# Patient Record
Sex: Female | Born: 1940 | Race: White | Hispanic: No | Marital: Married | Smoking: Never smoker
Health system: Southern US, Community
[De-identification: ages and names within clinical notes are randomized; demographics above are authoritative.]

## PROBLEM LIST (undated history)

## (undated) DIAGNOSIS — E119 Type 2 diabetes mellitus without complications: Secondary | ICD-10-CM

## (undated) DIAGNOSIS — I10 Essential (primary) hypertension: Secondary | ICD-10-CM

## (undated) DIAGNOSIS — K219 Gastro-esophageal reflux disease without esophagitis: Secondary | ICD-10-CM

## (undated) DIAGNOSIS — E039 Hypothyroidism, unspecified: Secondary | ICD-10-CM

## (undated) DIAGNOSIS — E785 Hyperlipidemia, unspecified: Secondary | ICD-10-CM

## (undated) DIAGNOSIS — Z9889 Other specified postprocedural states: Secondary | ICD-10-CM

## (undated) HISTORY — PX: ABDOMINAL HYSTERECTOMY: SHX81

## (undated) HISTORY — PX: NECK SURGERY: SHX720

## (undated) HISTORY — DX: Other forms of dyspnea: R06.09

---

## 2005-08-26 ENCOUNTER — Observation Stay (HOSPITAL_COMMUNITY): Admission: EM | Admit: 2005-08-26 | Discharge: 2005-08-27 | Payer: Self-pay | Admitting: Emergency Medicine

## 2006-10-10 ENCOUNTER — Observation Stay (HOSPITAL_COMMUNITY): Admission: EM | Admit: 2006-10-10 | Discharge: 2006-10-12 | Payer: Self-pay | Admitting: Emergency Medicine

## 2010-08-29 NOTE — H&P (Signed)
[name], [name] NO.:  [id number]   MEDICAL RECORD NO.:  [id number]          PATIENT TYPE:  EMS   LOCATION:  MAJO                         FACILITY:  [facility]   PHYSICIAN:  Herbie Saxon, MDDATE OF BIRTH:  02/28/1941   DATE OF ADMISSION:  10/10/2006  DATE OF DISCHARGE:                              HISTORY & PHYSICAL   PRIMARY CARE PHYSICIAN:  Unassigned.   The patient is from [place].   PRESENTING COMPLAINT:  Left arm numbness.  Left chest pain, 2 days'  duration.   HISTORY OF PRESENTING COMPLAINT:  This is a 70 year old Caucasian lady  who noticed left arm and finger tingling with numbness yesterday evening  associated with slurred speech and weakness in the left hand.  All these  symptoms have improved overnight; however, she had been experiencing  dull 6/10 left-sided chest pain radiating to the left side of the neck  associated with mild shortness of breath, nausea, and weakness.  There  is no episode of syncope.  There is no difficulty with swallowing.  The  patient denied any facial droop.  However, she noticed some deviation of  her left eye to the left yesterday, but this has resolved.  The patient  also gives a positive history for intermittent ankle swelling, heat  intolerance, and intermittent diarrhea.   PAST MEDICAL HISTORY:  1. Diabetes.  2. Hypercholesterolemia.  3. Hypertension.  4. Hypothyroid.   PAST SURGERIES:  1. Hysterectomy.  2. Cardiac catheterization, which was normal, 2007, performed by Dr.      Clarene Duke.   FAMILY HISTORY:  Mother had brain aneurysm, and father had pancreatic  cancer.   SOCIAL HISTORY:  She denies any history of alcohol, tobacco, or drug  use.   DRUG ALLERGIES:  CODEINE and IV DYE.   MEDICATIONS:  1. Avandia 4/500 mg daily.  2. Lisinopril 1 tablet daily.  3. Synthroid 50 mcg daily.  4. Aspirin 1 tablet daily.  5. Trazodone 1 tablet daily.  6. Centrum.  7. Fish oil.  8. Vytorin.   PHYSICAL EXAMINATION:  GENERAL:  She is an elderly lady, and she is not  in acute respiratory distress.  VITAL SIGNS:  Temperature is 98, pulse is 82, respiratory rate is 16,  blood pressure 159/63.  NECK:  Supple.  There is no lymphadenopathy.  HEAD:  Atraumatic, normocephalic.  Oropharynx is clear.  There is no  carotid bruits.  CHEST:  Clinically clear.  BREAST:  No masses.  HEART:  Sounds 1 and 2, no murmurs.  ABDOMEN:  She has truncal obesity, soft, nontender.  NEUROLOGIC:  She is alert, oriented x3.  There is no facial palsy, no  gaze deviation.  Extraocular muscles are intact.  Power is 5 in all  limbs.  Deep tendon reflexes are 2 in all limbs.  Mild left arm  monoparesis.  Cranial nerves I to XII grossly intact.  Peripheral pulses  present.  No edema.   AVAILABLE LABORATORY:  WBC is 10, hematocrit 38, platelet count is 333.  Troponin less than 0.05.  Glucose is 109.  Sodium 137, potassium 4.7,  chloride 106,  bicarbonate 24.  BUN 32, creatinine 1.9, AST is 94, ALT is  113, D-dimer is 0.62.   Also note the patient has a history of claustrophobia.   ASSESSMENT:  1. Transient ischemic attack.  2. Chest pain.  Rule out pulmonary embolism.  3. Acute-on-chronic renal failure.  4. Elevated liver function tests on Statins.  5. Moderately severe hypertension.  6. Anxiety.  7. Diabetes is stable.  8. Hypercholesterolemia.  9. Hypothyroid.   The patient is to be admitted to telemetry bed.  We will get an MRI of  the brain and MRI of the chest.  Follow up cardiac enzymes and EKG every  8 x3.  Gentle IV fluid hydration.  We will hold her lisinopril,  metformin, and Vytorin.  Protonix 40 mg IV daily.  Lovenox 30 mg subcu  daily.  Morphine 2 mg IV every 6 hours p.r.n.  O2 two liters nasal  cannula.  Repeat pulse oxygen at 92.  Aspirin 325 mg p.o. daily.  Metoprolol 25 mg b.i.d.  Check the phosphate and CMP in the morning.  I  will obtain abdominal ultrasound to see the liver and the  renal anatomy.  Morphine 2 mg IV every 6 hours p.r.n. for chest pain.  Xanax 0.5 mg p.o.  b.i.d.  Prior to MRI, the patient should be given Ativan 2 mg IV; start  5 to 10 minutes prior to MRI and then every 8 hours p.r.n.  She is a  Full Code.  EMS medication and tests explained to her.  She verbalizes  understanding.      Herbie Saxon, MD  Electronically Signed     MIO/MEDQ  D:  10/10/2006  T:  10/11/2006  Job:  [id number]

## 2010-08-29 NOTE — Discharge Summary (Signed)
[name], [name] NO.:  [id number]   MEDICAL RECORD NO.:  [id number]          PATIENT TYPE:  INP   LOCATION:  3733                         FACILITY:  [facility]   PHYSICIAN:  Beckey Rutter, MD  DATE OF BIRTH:  [birth year]   DATE OF ADMISSION:  10/10/2006  DATE OF DISCHARGE:  10/12/2006                               DISCHARGE SUMMARY   PRIMARY CARE PHYSICIAN:  Dr. Welton Rogers.   CHIEF COMPLAINT:  1. Left arm numbness.  2. Left chest pain for 2 days duration.   HISTORY OF PRESENT ILLNESS:  A 70 year old Caucasian with tingling left  fingers and hands, associated with some weakness.  Patient has stated  that these episodes come to her frequently for the last year or so.   HOSPITAL COURSE:  1. During hospitalization, patient was evaluated to rule out a stroke.      Mostly, all the tests are negative.  The MRI showed a left small      questionable nodule that I recommended to be followed up by      gadolinium contrast, but the patient stated that she does not want      to do the MRI again, at least at this time.  Furthermore, this      lesion is in the left side, while the symptoms are also in the left      side, which makes it unlikely to be the causing of her      symptomatology.  So, patient will be discharged today to be      followed up by her primary physician and preferably by a      neurologist.  2. Elevated liver enzymes.  The patient was noticed to have elevation      in her liver enzymes.  Numbers are 94, 113.  The repeat of the      liver enzymes are 52 and 77, after the Zocor was discontinued.  The      patient will be discharged without Zocor, until further followup      cardiac enzymes and probably re-start Zocor.  Since patient's risk      of TIA is considerable, she would benefit from starting a      cholesterol-lowering medication, as well as secondary prevention of      TIA at this time.  But, for the sake of showing the trend of the      liver  enzymes, I will not start Zocor at this time, leave it for      the primary physician to decide at his discretion.   HOSPITAL PROCEDURE:  1. CT head done on the day of presentation.  Impression:  Negative non-      contrast head CT.  2. MR brain without contrast showing small lesions in the right      frontal white matter and left temporal parietal cortex.  These are      likely area of chronic ischemia.  If the patient has known      malignancy or there is a concern of metastatic disease, I would  suggest the patient have a followup study with gadolinium, to be      sure there are no enhancing metastatic deposits.  3. The patient had MR chest on the 27th of June 2008, showing no gross      abnormality involving the thoracic aorta as described.  4. The patient had abdominal ultrasound, complete abdomen, showing      technically difficult to scan secondary to patient body habitus and      substantial bowel gas.  No abnormalities identified.  5. The patient had a homocysteine level today, is 7.8.  Her CMP      showing sodium 138, potassium 4.3, chloride 102, glucose 132, BUN      14 and creatinine 0.86.  White count is 8.1, hemoglobin 11.6,      hematocrit 34.6, platelets 296.  Thyroid stimulating hormone is      1.65, lipid profile showing cholesterol 176, triglyceride 147,      cholesterol HDL 36, LDL 111.  Her AST is 52 and ALT is 77.  Her      alkaline phosphatase is 70, and total bilirubin is 7.8.  On the      admission date October 10, 2006, her AST is 94 and ALT is 114. Her      alkaline phosphatase is 88 and bilirubin is 0.7.   DISCHARGE DIAGNOSES:  1. Tingling left arm.  Could be because of transient ischemic attack,      with essentially negative workup for a stroke.  2. Small lesions in the right frontal white matter and temporal      parietal cortex.  Likely areas of chronic ischemia, though      malignancy could not be ruled out.  3. Obesity.  4. Diabetes.  5.  Hypercholesterolemia.  6. Elevated liver enzymes.  7. Hypertension.  8. Hypothyroidism.   DISCHARGE MEDICATIONS:  1. Avandia.  2. Lisinopril.  3. Synthroid.  4. Aspirin.  5. Trazodone.  6. Centrum with vitamins.  7. Fish oil.   DISCHARGE PLAN:  1. As discussed above, the patient would benefit from secondary      prevention of transient ischemic attack/cerebrovascular accidents,      by lowering her cholesterol and checking her blood pressure and      diabetes.  The statin medication was discontinued now, and patient      was instructed to follow up with her primary physician to reinstate      the statins after following the trend of the liver enzymes.  2. The patient had small nodules in the left side of her brain, that      it was recommended to follow up with a neurologist and maybe to      follow up with gadolinium contrast MRI.   The patient is stable state for discharge.  She is aware of the  discharge plan and the follow-up recommendation, and she is agreeable to  it.      Beckey Rutter, MD  Electronically Signed     EME/MEDQ  D:  10/12/2006  T:  10/12/2006  Job:  [id number]

## 2010-09-01 NOTE — Discharge Summary (Signed)
NAMEDAMARI, HILTZ NO.:  [id number]   MEDICAL RECORD NO.:  [id number]          PATIENT TYPE:  INP   LOCATION:  2854                         FACILITY:  [facility]   PHYSICIAN:  Darlin Priestly, MD  DATE OF BIRTH:  [birth year]   DATE OF ADMISSION:  08/26/2005  DATE OF DISCHARGE:  08/27/2005                                 DISCHARGE SUMMARY   DIAGNOSES:  1.  Chest pain, etiology undetermined.  2.  Hypertension.  3.  Hyperthyroidism.  4.  Diabetes mellitus.  5.  Hyperlipidemia.   PROCEDURES:  Cardiac catheterization performed on Aug 27, 2005 by Dr.  Jenne Campus revealed essentially normal coronaries except  in the LAD.  EF 60%.   Etiology of chest pain undetermined.  Recommended consider GI workup for  chest pain.   HISTORY OF PRESENT ILLNESS:  This is a 70 year old Caucasian female who  experienced chest pain and total duration of three days.  Prior to  presentation, the chest pain lasted greater than 5 minutes and radiated to  the left arm.  She had nausea and some shortness of breath.  No weakness,  but denied any diaphoresis.  The patient had prior history of hypertension  and positive family history of heart disease.  She was admitted and ruled  out for MI protocol and scheduled for catheterization on the next day,  Monday, June 27, 2005.   She was ruled out for point of care cardiac markers the next morning, under  rim cardiac catheterization.  For complete description of this procedure,  please refer to dictating note of Dr. Jenne Campus.  The patient had __________  controlled blood pressure, and Dr. Jenne Campus added the Norvasc to her regimen  after the catheterization.   She tolerated the procedure well and was transferred to the Short Stay Unit  after the cath, and was ready to be discharged home after her bedrest  expired and she ambulated down the ward.   LABORATORY DATA:  As mentioned above, point of care markers were negative  x3.  Sodium 141, potassium  4.2, BUN 18, creatinine 1.0, glucose 161,  hemoglobin 12.9, hematocrit 38.   DISCHARGE INSTRUCTIONS:  1.  Diet:  Low-fat, low-cholesterol diet.  2.  Activities:  She was allowed to take a shower and wash the puncture site      with warm water and mild soap with rubbing and pat it dry.  She can do      activities slowly and do not lift greater than 5 pounds and do not drive      for 3 days post catheter.   DISCHARGE MEDICATIONS:  1.  Norvasc 5 mg daily.  2.  Lisinopril/HCTZ 20/12.5 mg daily.  3.  Vytorin 10/20 mg daily.  4.  Avandamet 4/500 mg b.i.d. resume on Thursday.  5.  Aspirin 31 mg daily.  6.  Protonix 40 mg daily.  7.  Synthroid 50 mcg daily.   FOLLOWUP:  Return to Dr. Clarene Duke in the office on Sep 06, 2005, at 12:00  noon.      Raymon Mutton, P.A.      Molly Maduro  H. Jenne Campus, MD  Electronically Signed    MK/MEDQ  D:  08/27/2005  T:  08/28/2005  Job:  [id number]   cc:   [facility]   [facility]

## 2010-09-01 NOTE — Cardiovascular Report (Signed)
[name], [name] NO.:  [id number]   MEDICAL RECORD NO.:  [id number]          PATIENT TYPE:  INP   LOCATION:  2033                         FACILITY:  [facility]   PHYSICIAN:  Darlin Priestly, MD  DATE OF BIRTH:  12-13-1940   DATE OF PROCEDURE:  08/27/2005  DATE OF DISCHARGE:                              CARDIAC CATHETERIZATION   PROCEDURES:  1.  Left heart catheterization.  2.  Coronary angiography.  3.  Left ventriculogram.  4.  Abdominal aortogram.   CARDIOLOGIST:  Darlin Priestly, M.D.   COMPLICATIONS:  None.   INDICATIONS:  [name] is a 70 year old female patient of Dr. Welton Flakes at  [facility] with a history of hypertension, hypothyroidism,  diabetes, hyperlipidemia, obesity who presented to the ER on Aug 26, 2005,  with complaint of substernal chest pain radiating to her left arm.  This was  relieved with aspirin.  She has had multiple ER admissions for chest pain  with negative stress test.  She is now for cardiac catheterization to rule  out significant CAD.   DESCRIPTION OF PROCEDURE:  After obtaining informed consent, the patient was  brought to the cardiac catheterization lab.  Right groin was shaved, prepped  and draped in usual sterile fashion.  Monitoring established.  Using  modified Seldinger technique, a 6-French arterial sheath was inserted in the  right femoral artery. A 6-French diagnostic catheter was used to perform  diagnostic angiography.   The left main is a large vessel with no significant disease.   The LAD is a large vessel that courses to the apex and gives rise to 1  diagonal branch.  The LAD is noted to have mild kinking segment in the mid  artery but no high-grade stenosis.   The first diagonal is a medium size vessel which bifurcates distally with no  significant disease.   Left circumflex is a large vessel coursing in the A-V groove and gives rise  to 2 obtuse marginal branches.  The A-V groove  circumflex has no significant  disease.   The first OM is a large vessel which bifurcates distally and runs as a ramus  intermedius.  There is no significant disease in the first OM.   The second OM is a medium size vessel which bifurcates in the mid segment  with no significant disease.   The right coronary artery is a large vessel which is dominant and gives rise  to a PDA as well as a posterolateral branch.  There is no significant  disease in the RCA, PDA, or posterolateral branch.   Left ventriculogram was estimated at 60%.   Abdominal aortogram showed no significant aortic stenosis.   HEMODYNAMIC SYSTEM:  Left arterial pressure 176/90.  LV pressure 173/18, LV  EDP 25.   CONCLUSION:  1.  Non-critical coronary artery disease.  2.  Normal left ventricular systolic function.  3.  No evidence for aortic stenosis.  4.  Systemic hypertension.      Darlin Priestly, MD  Electronically Signed     RHM/MEDQ  D:  08/27/2005  T:  08/27/2005  Job:  [id number]   cc:   Thereasa Solo. Little, M.D.  Fax: [phone]   Dr. Welton Flakes, [facility]

## 2010-10-24 ENCOUNTER — Other Ambulatory Visit: Payer: Self-pay | Admitting: Family Medicine

## 2010-10-24 DIAGNOSIS — M5410 Radiculopathy, site unspecified: Secondary | ICD-10-CM

## 2010-10-24 DIAGNOSIS — M542 Cervicalgia: Secondary | ICD-10-CM

## 2010-10-28 ENCOUNTER — Ambulatory Visit
Admission: RE | Admit: 2010-10-28 | Discharge: 2010-10-28 | Disposition: A | Discharge status: Home / Self Care | Payer: Medicare Other | Source: Ambulatory Visit | Attending: Family Medicine | Admitting: Family Medicine

## 2010-10-28 DIAGNOSIS — M542 Cervicalgia: Secondary | ICD-10-CM

## 2010-10-28 DIAGNOSIS — M5410 Radiculopathy, site unspecified: Secondary | ICD-10-CM

## 2010-11-23 ENCOUNTER — Ambulatory Visit (HOSPITAL_COMMUNITY)
Admission: RE | Admit: 2010-11-23 | Discharge: 2010-11-23 | Disposition: A | Discharge status: Home / Self Care | Payer: Medicare Other | Source: Ambulatory Visit | Attending: Neurosurgery | Admitting: Neurosurgery

## 2010-11-23 ENCOUNTER — Other Ambulatory Visit (HOSPITAL_COMMUNITY): Payer: Self-pay | Admitting: Neurosurgery

## 2010-11-23 ENCOUNTER — Encounter (HOSPITAL_COMMUNITY)
Admission: RE | Admit: 2010-11-23 | Discharge: 2010-11-23 | Disposition: A | Discharge status: Home / Self Care | Payer: Medicare Other | Source: Ambulatory Visit | Attending: Neurosurgery | Admitting: Neurosurgery

## 2010-11-23 DIAGNOSIS — Z01812 Encounter for preprocedural laboratory examination: Secondary | ICD-10-CM | POA: Insufficient documentation

## 2010-11-23 DIAGNOSIS — Z01810 Encounter for preprocedural cardiovascular examination: Secondary | ICD-10-CM | POA: Insufficient documentation

## 2010-11-23 DIAGNOSIS — R9431 Abnormal electrocardiogram [ECG] [EKG]: Secondary | ICD-10-CM | POA: Insufficient documentation

## 2010-11-23 DIAGNOSIS — Z01811 Encounter for preprocedural respiratory examination: Secondary | ICD-10-CM | POA: Insufficient documentation

## 2010-11-23 DIAGNOSIS — M5020 Other cervical disc displacement, unspecified cervical region: Secondary | ICD-10-CM | POA: Insufficient documentation

## 2010-11-23 LAB — CBC
Hemoglobin: 12.4 g/dL (ref 12.0–15.0)
MCH: 31.7 pg (ref 26.0–34.0)
MCHC: 33.2 g/dL (ref 30.0–36.0)
RBC: 3.91 MIL/uL (ref 3.87–5.11)
RDW: 15.1 % (ref 11.5–15.5)
WBC: 8.6 10*3/uL (ref 4.0–10.5)

## 2010-11-23 LAB — BASIC METABOLIC PANEL
BUN: 12 mg/dL (ref 6–23)
GFR calc Af Amer: >60 mL/min (ref >60)
GFR calc non Af Amer: >60 mL/min (ref >60)
Glucose, Bld: 182 mg/dL — ABNORMAL HIGH (ref 70–99)
Potassium: 5.1 mEq/L (ref 3.5–5.1)

## 2010-11-29 ENCOUNTER — Ambulatory Visit (HOSPITAL_COMMUNITY): Payer: Medicare Other

## 2010-11-29 ENCOUNTER — Inpatient Hospital Stay (HOSPITAL_COMMUNITY)
Admission: RE | Admit: 2010-11-29 | Discharge: 2010-11-30 | DRG: 473 | Disposition: A | Discharge status: Home / Self Care | Payer: Medicare Other | Source: Ambulatory Visit | Attending: Neurosurgery | Admitting: Neurosurgery

## 2010-11-29 DIAGNOSIS — M5020 Other cervical disc displacement, unspecified cervical region: Principal | ICD-10-CM | POA: Diagnosis present

## 2010-11-29 DIAGNOSIS — E119 Type 2 diabetes mellitus without complications: Secondary | ICD-10-CM | POA: Diagnosis present

## 2010-11-29 DIAGNOSIS — I10 Essential (primary) hypertension: Secondary | ICD-10-CM | POA: Diagnosis present

## 2010-11-29 LAB — GLUCOSE, CAPILLARY
Glucose-Capillary: 155 mg/dL — ABNORMAL HIGH (ref 70–99)
Glucose-Capillary: 170 mg/dL — ABNORMAL HIGH (ref 70–99)

## 2010-11-30 LAB — GLUCOSE, CAPILLARY: Glucose-Capillary: 212 mg/dL — ABNORMAL HIGH (ref 70–99)

## 2010-12-02 NOTE — Op Note (Signed)
  [name], [name] NO.:  [id number]  MEDICAL RECORD NO.:  [id number]  LOCATION:  3016                         FACILITY:  [facility]  PHYSICIAN:  Hilda Lias, M.D.   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  PROCEDURE:  Anterior C6-7 diskectomy, decompression of the spinal cord and both C7 nerve root, interbody fusion with cage, plate, and microscope.  SURGEON:  Hilda Lias, MD  ASSISTANT:  Coletta Memos, MD.  CLINICAL HISTORY:  Mr. [name] is a 70 year old female complaining of neck and right upper extremity pain which has been going on for almost several weeks.  The only way that she can get relief from the pain is _holding her arm against the chest wall_________ .  X-rays showed spondylosis with herniated disk at level of C6-7.  Surgery was advised.  PROCEDURE:  The patient was taken to the OR; and after intubation, the left side of the neck was cleaned with DuraPrep.  A transverse incision was made through the skin and subcutaneous tissue.  The patient has quite a bit of short neck.  Traction was done.  We used 2 needles, the upper needle was at the level of C5-6 and the lower at the level of C6- 7.  From then on, we brought the microscope and we opened the anterior ligament at the level of C6-7.  With the micro curette, we did a total diskectomy.  We had to use a drill just to remove ligament__________ of spondylosis posteriorly.  Then, we opened the posterior ligament and indeed central to the right, there was like 2 foraminal __________ compromising the C7 nerve root.  Decompression of the C7 nerve roots, first the right and then the left as well as the spinal cord was  done. The endplate were drilled and a cage of 7-mm height lordotic with _bdx_________ and autograft inside was inserted.  This was followed by a plate using 4 screws.  Lateral cervical spine showed good position of the plate and screws.  Hemostasis was accomplished.  We investigated the carotid, the jugular and  the esophagus.  We waited 5 minutes just to be sure that we had good hemostasis.  Once everything was clean, the area was closed with Vicryl and Steri-Strips.          ______________________________ Hilda Lias, M.D.     EB/MEDQ  D:  11/29/2010  T:  11/30/2010  Job:  [id number]  Electronically Signed by Hilda Lias M.D. on 12/02/2010 09:47:31 AM

## 2010-12-04 LAB — GLUCOSE, CAPILLARY: Glucose-Capillary: 154 mg/dL — ABNORMAL HIGH (ref 70–99)

## 2011-01-31 LAB — URINALYSIS, ROUTINE W REFLEX MICROSCOPIC
Bilirubin Urine: NEGATIVE
Glucose, UA: NEGATIVE
Hgb urine dipstick: NEGATIVE
Ketones, ur: NEGATIVE
Nitrite: NEGATIVE
Protein, ur: NEGATIVE
Specific Gravity, Urine: 1.013
Urobilinogen, UA: 0.2
pH: 6

## 2011-01-31 LAB — COMPREHENSIVE METABOLIC PANEL WITH GFR
ALT: 77 — ABNORMAL HIGH
AST: 52 — ABNORMAL HIGH
Albumin: 3.1 — ABNORMAL LOW
Alkaline Phosphatase: 70
BUN: 14
CO2: 28
Calcium: 8.5
Chloride: 102
Creatinine, Ser: 0.86
GFR calc non Af Amer: >60
Glucose, Bld: 132 — ABNORMAL HIGH
Potassium: 4.3
Sodium: 138
Total Bilirubin: 0.8
Total Protein: 6.4

## 2011-01-31 LAB — B-NATRIURETIC PEPTIDE (CONVERTED LAB): Pro B Natriuretic peptide (BNP): <30

## 2011-01-31 LAB — URINE CULTURE
Colony Count: NO GROWTH
Culture: NO GROWTH

## 2011-01-31 LAB — CBC
HCT: 34.6 — ABNORMAL LOW
Hemoglobin: 11.6 — ABNORMAL LOW
MCHC: 33.6
MCV: 92.1
Platelets: 296
RBC: 3.75 — ABNORMAL LOW
RBC: 4.22
RDW: 14.1 — ABNORMAL HIGH
WBC: 10.4
WBC: 8.1

## 2011-01-31 LAB — COMPREHENSIVE METABOLIC PANEL
Alkaline Phosphatase: 88
BUN: 11
CO2: 25
Creatinine, Ser: 0.9
GFR calc non Af Amer: >60
Glucose, Bld: 109 — ABNORMAL HIGH
Total Protein: 7.5

## 2011-01-31 LAB — I-STAT 8, (EC8 V) (CONVERTED LAB)
BUN: 32 — ABNORMAL HIGH
Bicarbonate: 24.1 — ABNORMAL HIGH
Glucose, Bld: 109 — ABNORMAL HIGH
Sodium: 137
TCO2: 25
pCO2, Ven: 35.9 — ABNORMAL LOW

## 2011-01-31 LAB — POCT CARDIAC MARKERS
CKMB, poc: <1 — ABNORMAL LOW
Myoglobin, poc: 63
Troponin i, poc: <0.05

## 2011-01-31 LAB — POCT I-STAT CREATININE
Creatinine, Ser: 1.9 — ABNORMAL HIGH
Operator id: 288331

## 2011-01-31 LAB — LIPID PANEL
HDL: 36 — ABNORMAL LOW
Total CHOL/HDL Ratio: 4.9
Triglycerides: 147
VLDL: 29

## 2011-01-31 LAB — CARDIAC PANEL(CRET KIN+CKTOT+MB+TROPI)
CK, MB: 0.8
Relative Index: INVALID
Relative Index: INVALID
Total CK: 43

## 2011-01-31 LAB — APTT: aPTT: 31

## 2011-01-31 LAB — CK TOTAL AND CKMB (NOT AT ARMC)
CK, MB: 1
Relative Index: INVALID
Total CK: 38

## 2011-01-31 LAB — TROPONIN I

## 2011-01-31 LAB — TSH: TSH: 1.651

## 2011-01-31 LAB — MAGNESIUM: Magnesium: 1.6

## 2011-01-31 LAB — CALCIUM: Calcium: 9

## 2011-01-31 LAB — PHOSPHORUS: Phosphorus: 3.2

## 2011-01-31 LAB — D-DIMER, QUANTITATIVE: D-Dimer, Quant: 0.62 — ABNORMAL HIGH

## 2013-10-13 DIAGNOSIS — G43909 Migraine, unspecified, not intractable, without status migrainosus: Secondary | ICD-10-CM

## 2013-10-13 HISTORY — DX: Migraine, unspecified, not intractable, without status migrainosus: G43.909

## 2014-05-05 DIAGNOSIS — E119 Type 2 diabetes mellitus without complications: Secondary | ICD-10-CM | POA: Diagnosis not present

## 2014-05-05 DIAGNOSIS — E780 Pure hypercholesterolemia: Secondary | ICD-10-CM | POA: Diagnosis not present

## 2014-05-05 DIAGNOSIS — R1032 Left lower quadrant pain: Secondary | ICD-10-CM | POA: Diagnosis not present

## 2014-05-05 DIAGNOSIS — R109 Unspecified abdominal pain: Secondary | ICD-10-CM | POA: Diagnosis not present

## 2014-05-05 DIAGNOSIS — K439 Ventral hernia without obstruction or gangrene: Secondary | ICD-10-CM | POA: Diagnosis not present

## 2014-05-05 DIAGNOSIS — I10 Essential (primary) hypertension: Secondary | ICD-10-CM | POA: Diagnosis not present

## 2014-05-06 DIAGNOSIS — I10 Essential (primary) hypertension: Secondary | ICD-10-CM | POA: Diagnosis not present

## 2014-05-06 DIAGNOSIS — R1084 Generalized abdominal pain: Secondary | ICD-10-CM | POA: Diagnosis not present

## 2014-05-06 DIAGNOSIS — K439 Ventral hernia without obstruction or gangrene: Secondary | ICD-10-CM | POA: Diagnosis not present

## 2014-05-12 DIAGNOSIS — I10 Essential (primary) hypertension: Secondary | ICD-10-CM | POA: Diagnosis not present

## 2014-05-12 DIAGNOSIS — I4510 Unspecified right bundle-branch block: Secondary | ICD-10-CM | POA: Diagnosis not present

## 2014-05-12 DIAGNOSIS — E780 Pure hypercholesterolemia: Secondary | ICD-10-CM | POA: Diagnosis not present

## 2014-05-12 DIAGNOSIS — D649 Anemia, unspecified: Secondary | ICD-10-CM | POA: Diagnosis not present

## 2014-05-12 DIAGNOSIS — K430 Incisional hernia with obstruction, without gangrene: Secondary | ICD-10-CM | POA: Diagnosis not present

## 2014-05-12 DIAGNOSIS — K219 Gastro-esophageal reflux disease without esophagitis: Secondary | ICD-10-CM | POA: Diagnosis not present

## 2014-05-12 DIAGNOSIS — E1100 Type 2 diabetes mellitus with hyperosmolarity without nonketotic hyperglycemic-hyperosmolar coma (NKHHC): Secondary | ICD-10-CM | POA: Diagnosis not present

## 2014-05-12 DIAGNOSIS — G629 Polyneuropathy, unspecified: Secondary | ICD-10-CM | POA: Diagnosis not present

## 2014-05-12 DIAGNOSIS — E119 Type 2 diabetes mellitus without complications: Secondary | ICD-10-CM | POA: Diagnosis not present

## 2014-05-12 DIAGNOSIS — K432 Incisional hernia without obstruction or gangrene: Secondary | ICD-10-CM | POA: Diagnosis not present

## 2014-05-12 DIAGNOSIS — R55 Syncope and collapse: Secondary | ICD-10-CM | POA: Diagnosis not present

## 2014-05-12 DIAGNOSIS — E861 Hypovolemia: Secondary | ICD-10-CM | POA: Diagnosis not present

## 2014-05-28 DIAGNOSIS — R0989 Other specified symptoms and signs involving the circulatory and respiratory systems: Secondary | ICD-10-CM | POA: Diagnosis not present

## 2014-05-28 DIAGNOSIS — F411 Generalized anxiety disorder: Secondary | ICD-10-CM | POA: Diagnosis not present

## 2014-05-28 DIAGNOSIS — R5381 Other malaise: Secondary | ICD-10-CM | POA: Diagnosis not present

## 2014-06-03 DIAGNOSIS — R630 Anorexia: Secondary | ICD-10-CM | POA: Diagnosis not present

## 2014-06-03 DIAGNOSIS — E1165 Type 2 diabetes mellitus with hyperglycemia: Secondary | ICD-10-CM | POA: Diagnosis not present

## 2014-06-03 DIAGNOSIS — D649 Anemia, unspecified: Secondary | ICD-10-CM | POA: Diagnosis not present

## 2014-08-05 DIAGNOSIS — L820 Inflamed seborrheic keratosis: Secondary | ICD-10-CM | POA: Diagnosis not present

## 2014-08-05 DIAGNOSIS — L918 Other hypertrophic disorders of the skin: Secondary | ICD-10-CM | POA: Diagnosis not present

## 2014-09-28 DIAGNOSIS — R040 Epistaxis: Secondary | ICD-10-CM | POA: Diagnosis not present

## 2014-09-29 DIAGNOSIS — R040 Epistaxis: Secondary | ICD-10-CM | POA: Diagnosis not present

## 2014-09-30 DIAGNOSIS — I10 Essential (primary) hypertension: Secondary | ICD-10-CM | POA: Diagnosis not present

## 2014-09-30 DIAGNOSIS — R040 Epistaxis: Secondary | ICD-10-CM | POA: Diagnosis not present

## 2014-10-06 DIAGNOSIS — E1165 Type 2 diabetes mellitus with hyperglycemia: Secondary | ICD-10-CM | POA: Diagnosis not present

## 2014-10-06 DIAGNOSIS — R040 Epistaxis: Secondary | ICD-10-CM | POA: Diagnosis not present

## 2014-10-06 DIAGNOSIS — Z0000 Encounter for general adult medical examination without abnormal findings: Secondary | ICD-10-CM | POA: Diagnosis not present

## 2014-10-06 DIAGNOSIS — G4700 Insomnia, unspecified: Secondary | ICD-10-CM | POA: Diagnosis not present

## 2014-10-06 DIAGNOSIS — I10 Essential (primary) hypertension: Secondary | ICD-10-CM | POA: Diagnosis not present

## 2014-11-23 DIAGNOSIS — H35369 Drusen (degenerative) of macula, unspecified eye: Secondary | ICD-10-CM | POA: Diagnosis not present

## 2014-11-23 DIAGNOSIS — H5210 Myopia, unspecified eye: Secondary | ICD-10-CM | POA: Diagnosis not present

## 2014-11-23 DIAGNOSIS — E119 Type 2 diabetes mellitus without complications: Secondary | ICD-10-CM | POA: Diagnosis not present

## 2014-11-23 DIAGNOSIS — H5203 Hypermetropia, bilateral: Secondary | ICD-10-CM | POA: Diagnosis not present

## 2014-12-13 DIAGNOSIS — R062 Wheezing: Secondary | ICD-10-CM | POA: Diagnosis not present

## 2014-12-13 DIAGNOSIS — J209 Acute bronchitis, unspecified: Secondary | ICD-10-CM | POA: Diagnosis not present

## 2014-12-16 DIAGNOSIS — K209 Esophagitis, unspecified: Secondary | ICD-10-CM | POA: Diagnosis not present

## 2014-12-16 DIAGNOSIS — K219 Gastro-esophageal reflux disease without esophagitis: Secondary | ICD-10-CM | POA: Diagnosis not present

## 2014-12-27 DIAGNOSIS — Z23 Encounter for immunization: Secondary | ICD-10-CM | POA: Diagnosis not present

## 2015-03-29 DIAGNOSIS — E1165 Type 2 diabetes mellitus with hyperglycemia: Secondary | ICD-10-CM | POA: Diagnosis not present

## 2015-03-29 DIAGNOSIS — D649 Anemia, unspecified: Secondary | ICD-10-CM | POA: Diagnosis not present

## 2015-03-29 DIAGNOSIS — E039 Hypothyroidism, unspecified: Secondary | ICD-10-CM | POA: Diagnosis not present

## 2015-03-29 DIAGNOSIS — E782 Mixed hyperlipidemia: Secondary | ICD-10-CM | POA: Diagnosis not present

## 2015-03-29 DIAGNOSIS — Z79899 Other long term (current) drug therapy: Secondary | ICD-10-CM | POA: Diagnosis not present

## 2015-04-28 DIAGNOSIS — E119 Type 2 diabetes mellitus without complications: Secondary | ICD-10-CM | POA: Diagnosis not present

## 2015-04-28 DIAGNOSIS — H35369 Drusen (degenerative) of macula, unspecified eye: Secondary | ICD-10-CM | POA: Diagnosis not present

## 2015-05-23 DIAGNOSIS — H2513 Age-related nuclear cataract, bilateral: Secondary | ICD-10-CM | POA: Diagnosis not present

## 2015-05-23 DIAGNOSIS — E119 Type 2 diabetes mellitus without complications: Secondary | ICD-10-CM | POA: Diagnosis not present

## 2015-05-26 DIAGNOSIS — D649 Anemia, unspecified: Secondary | ICD-10-CM | POA: Diagnosis not present

## 2015-06-15 DIAGNOSIS — R300 Dysuria: Secondary | ICD-10-CM | POA: Diagnosis not present

## 2015-06-15 DIAGNOSIS — M545 Low back pain: Secondary | ICD-10-CM | POA: Diagnosis not present

## 2015-11-01 DIAGNOSIS — E782 Mixed hyperlipidemia: Secondary | ICD-10-CM | POA: Diagnosis not present

## 2015-11-01 DIAGNOSIS — E1165 Type 2 diabetes mellitus with hyperglycemia: Secondary | ICD-10-CM | POA: Diagnosis not present

## 2015-11-01 DIAGNOSIS — Z79899 Other long term (current) drug therapy: Secondary | ICD-10-CM | POA: Diagnosis not present

## 2015-11-01 DIAGNOSIS — J069 Acute upper respiratory infection, unspecified: Secondary | ICD-10-CM | POA: Diagnosis not present

## 2015-11-24 DIAGNOSIS — H5210 Myopia, unspecified eye: Secondary | ICD-10-CM | POA: Diagnosis not present

## 2015-11-24 DIAGNOSIS — H5203 Hypermetropia, bilateral: Secondary | ICD-10-CM | POA: Diagnosis not present

## 2015-11-24 DIAGNOSIS — H35369 Drusen (degenerative) of macula, unspecified eye: Secondary | ICD-10-CM | POA: Diagnosis not present

## 2016-01-05 DIAGNOSIS — D539 Nutritional anemia, unspecified: Secondary | ICD-10-CM | POA: Diagnosis not present

## 2016-01-05 DIAGNOSIS — R0609 Other forms of dyspnea: Secondary | ICD-10-CM | POA: Diagnosis not present

## 2016-01-05 DIAGNOSIS — E1165 Type 2 diabetes mellitus with hyperglycemia: Secondary | ICD-10-CM | POA: Diagnosis not present

## 2016-01-05 DIAGNOSIS — I10 Essential (primary) hypertension: Secondary | ICD-10-CM | POA: Diagnosis not present

## 2016-01-12 DIAGNOSIS — D509 Iron deficiency anemia, unspecified: Secondary | ICD-10-CM | POA: Diagnosis not present

## 2016-01-16 DIAGNOSIS — Z23 Encounter for immunization: Secondary | ICD-10-CM | POA: Diagnosis not present

## 2016-01-16 DIAGNOSIS — D510 Vitamin B12 deficiency anemia due to intrinsic factor deficiency: Secondary | ICD-10-CM | POA: Diagnosis not present

## 2016-01-19 DIAGNOSIS — D509 Iron deficiency anemia, unspecified: Secondary | ICD-10-CM | POA: Diagnosis not present

## 2016-01-23 DIAGNOSIS — D510 Vitamin B12 deficiency anemia due to intrinsic factor deficiency: Secondary | ICD-10-CM | POA: Diagnosis not present

## 2016-01-30 DIAGNOSIS — E538 Deficiency of other specified B group vitamins: Secondary | ICD-10-CM | POA: Diagnosis not present

## 2016-02-06 DIAGNOSIS — E538 Deficiency of other specified B group vitamins: Secondary | ICD-10-CM | POA: Diagnosis not present

## 2016-03-06 DIAGNOSIS — E538 Deficiency of other specified B group vitamins: Secondary | ICD-10-CM | POA: Diagnosis not present

## 2016-04-02 DIAGNOSIS — L820 Inflamed seborrheic keratosis: Secondary | ICD-10-CM | POA: Diagnosis not present

## 2016-04-05 DIAGNOSIS — Z79899 Other long term (current) drug therapy: Secondary | ICD-10-CM | POA: Diagnosis not present

## 2016-04-05 DIAGNOSIS — E538 Deficiency of other specified B group vitamins: Secondary | ICD-10-CM | POA: Diagnosis not present

## 2016-04-05 DIAGNOSIS — E1165 Type 2 diabetes mellitus with hyperglycemia: Secondary | ICD-10-CM | POA: Diagnosis not present

## 2016-04-05 DIAGNOSIS — E782 Mixed hyperlipidemia: Secondary | ICD-10-CM | POA: Diagnosis not present

## 2016-05-11 DIAGNOSIS — E538 Deficiency of other specified B group vitamins: Secondary | ICD-10-CM | POA: Diagnosis not present

## 2016-09-08 DIAGNOSIS — J0100 Acute maxillary sinusitis, unspecified: Secondary | ICD-10-CM | POA: Diagnosis not present

## 2016-09-11 DIAGNOSIS — D539 Nutritional anemia, unspecified: Secondary | ICD-10-CM | POA: Diagnosis not present

## 2016-09-11 DIAGNOSIS — J329 Chronic sinusitis, unspecified: Secondary | ICD-10-CM | POA: Diagnosis not present

## 2016-09-11 DIAGNOSIS — J40 Bronchitis, not specified as acute or chronic: Secondary | ICD-10-CM | POA: Diagnosis not present

## 2016-09-11 DIAGNOSIS — R0609 Other forms of dyspnea: Secondary | ICD-10-CM | POA: Diagnosis not present

## 2016-09-11 DIAGNOSIS — Z6837 Body mass index (BMI) 37.0-37.9, adult: Secondary | ICD-10-CM | POA: Diagnosis not present

## 2016-09-21 DIAGNOSIS — R001 Bradycardia, unspecified: Secondary | ICD-10-CM | POA: Diagnosis not present

## 2016-09-21 DIAGNOSIS — D649 Anemia, unspecified: Secondary | ICD-10-CM | POA: Diagnosis not present

## 2016-09-21 DIAGNOSIS — E538 Deficiency of other specified B group vitamins: Secondary | ICD-10-CM | POA: Diagnosis not present

## 2016-09-21 DIAGNOSIS — Z79899 Other long term (current) drug therapy: Secondary | ICD-10-CM | POA: Diagnosis not present

## 2016-09-21 DIAGNOSIS — E039 Hypothyroidism, unspecified: Secondary | ICD-10-CM | POA: Diagnosis not present

## 2016-09-21 DIAGNOSIS — Z6837 Body mass index (BMI) 37.0-37.9, adult: Secondary | ICD-10-CM | POA: Diagnosis not present

## 2016-09-21 DIAGNOSIS — E1165 Type 2 diabetes mellitus with hyperglycemia: Secondary | ICD-10-CM | POA: Diagnosis not present

## 2016-09-24 DIAGNOSIS — I517 Cardiomegaly: Secondary | ICD-10-CM | POA: Diagnosis not present

## 2016-09-24 DIAGNOSIS — I361 Nonrheumatic tricuspid (valve) insufficiency: Secondary | ICD-10-CM | POA: Diagnosis not present

## 2016-09-24 DIAGNOSIS — I351 Nonrheumatic aortic (valve) insufficiency: Secondary | ICD-10-CM | POA: Diagnosis not present

## 2016-09-24 DIAGNOSIS — I350 Nonrheumatic aortic (valve) stenosis: Secondary | ICD-10-CM | POA: Diagnosis not present

## 2016-09-24 DIAGNOSIS — R0609 Other forms of dyspnea: Secondary | ICD-10-CM | POA: Diagnosis not present

## 2016-09-27 ENCOUNTER — Encounter (HOSPITAL_COMMUNITY): Payer: Self-pay

## 2016-09-27 ENCOUNTER — Emergency Department (HOSPITAL_COMMUNITY): Payer: Medicare HMO

## 2016-09-27 ENCOUNTER — Inpatient Hospital Stay (HOSPITAL_COMMUNITY)
Admission: EM | Admit: 2016-09-27 | Discharge: 2016-09-29 | DRG: 304 | Disposition: A | Discharge status: Home / Self Care | Payer: Medicare HMO | Attending: Internal Medicine | Admitting: Internal Medicine

## 2016-09-27 DIAGNOSIS — E039 Hypothyroidism, unspecified: Secondary | ICD-10-CM | POA: Diagnosis not present

## 2016-09-27 DIAGNOSIS — Z8249 Family history of ischemic heart disease and other diseases of the circulatory system: Secondary | ICD-10-CM

## 2016-09-27 DIAGNOSIS — I5033 Acute on chronic diastolic (congestive) heart failure: Secondary | ICD-10-CM | POA: Diagnosis present

## 2016-09-27 DIAGNOSIS — R079 Chest pain, unspecified: Secondary | ICD-10-CM | POA: Diagnosis present

## 2016-09-27 DIAGNOSIS — K219 Gastro-esophageal reflux disease without esophagitis: Secondary | ICD-10-CM | POA: Diagnosis not present

## 2016-09-27 DIAGNOSIS — Z800 Family history of malignant neoplasm of digestive organs: Secondary | ICD-10-CM

## 2016-09-27 DIAGNOSIS — R001 Bradycardia, unspecified: Secondary | ICD-10-CM | POA: Diagnosis not present

## 2016-09-27 DIAGNOSIS — Z90710 Acquired absence of both cervix and uterus: Secondary | ICD-10-CM | POA: Diagnosis not present

## 2016-09-27 DIAGNOSIS — Z7982 Long term (current) use of aspirin: Secondary | ICD-10-CM | POA: Diagnosis not present

## 2016-09-27 DIAGNOSIS — Z6837 Body mass index (BMI) 37.0-37.9, adult: Secondary | ICD-10-CM | POA: Diagnosis not present

## 2016-09-27 DIAGNOSIS — Z888 Allergy status to other drugs, medicaments and biological substances status: Secondary | ICD-10-CM | POA: Diagnosis not present

## 2016-09-27 DIAGNOSIS — I160 Hypertensive urgency: Principal | ICD-10-CM | POA: Diagnosis present

## 2016-09-27 DIAGNOSIS — I110 Hypertensive heart disease with heart failure: Secondary | ICD-10-CM | POA: Diagnosis not present

## 2016-09-27 DIAGNOSIS — E119 Type 2 diabetes mellitus without complications: Secondary | ICD-10-CM

## 2016-09-27 DIAGNOSIS — Z885 Allergy status to narcotic agent status: Secondary | ICD-10-CM

## 2016-09-27 DIAGNOSIS — I208 Other forms of angina pectoris: Secondary | ICD-10-CM | POA: Diagnosis not present

## 2016-09-27 DIAGNOSIS — I10 Essential (primary) hypertension: Secondary | ICD-10-CM | POA: Diagnosis not present

## 2016-09-27 DIAGNOSIS — E7800 Pure hypercholesterolemia, unspecified: Secondary | ICD-10-CM | POA: Diagnosis not present

## 2016-09-27 DIAGNOSIS — E785 Hyperlipidemia, unspecified: Secondary | ICD-10-CM | POA: Diagnosis present

## 2016-09-27 DIAGNOSIS — R0609 Other forms of dyspnea: Secondary | ICD-10-CM | POA: Diagnosis not present

## 2016-09-27 DIAGNOSIS — D539 Nutritional anemia, unspecified: Secondary | ICD-10-CM | POA: Diagnosis present

## 2016-09-27 HISTORY — DX: Essential (primary) hypertension: I10

## 2016-09-27 HISTORY — DX: Hyperlipidemia, unspecified: E78.5

## 2016-09-27 HISTORY — DX: Other specified postprocedural states: Z98.890

## 2016-09-27 HISTORY — DX: Gastro-esophageal reflux disease without esophagitis: K21.9

## 2016-09-27 HISTORY — DX: Hypothyroidism, unspecified: E03.9

## 2016-09-27 HISTORY — DX: Chest pain, unspecified: R07.9

## 2016-09-27 HISTORY — DX: Hypertensive urgency: I16.0

## 2016-09-27 HISTORY — DX: Type 2 diabetes mellitus without complications: E11.9

## 2016-09-27 HISTORY — DX: Nutritional anemia, unspecified: D53.9

## 2016-09-27 LAB — CBC
HCT: 27 % — ABNORMAL LOW (ref 36.0–46.0)
Hemoglobin: 8.4 g/dL — ABNORMAL LOW (ref 12.0–15.0)
MCH: 31.7 pg (ref 26.0–34.0)
MCHC: 31.1 g/dL (ref 30.0–36.0)
MCV: 101.9 fL — ABNORMAL HIGH (ref 78.0–100.0)
Platelets: 305 10*3/uL (ref 150–400)
RBC: 2.65 MIL/uL — ABNORMAL LOW (ref 3.87–5.11)
RDW: 19.2 % — ABNORMAL HIGH (ref 11.5–15.5)
WBC: 6 10*3/uL (ref 4.0–10.5)

## 2016-09-27 LAB — BASIC METABOLIC PANEL
Anion gap: 9 (ref 5–15)
BUN: 10 mg/dL (ref 6–20)
CO2: 27 mmol/L (ref 22–32)
Calcium: 8.5 mg/dL — ABNORMAL LOW (ref 8.9–10.3)
Chloride: 106 mmol/L (ref 101–111)
Creatinine, Ser: 0.89 mg/dL (ref 0.44–1.00)
GFR calc Af Amer: >60 mL/min (ref >60)
GFR calc non Af Amer: >60 mL/min (ref >60)
Glucose, Bld: 122 mg/dL — ABNORMAL HIGH (ref 65–99)
Potassium: 3.8 mmol/L (ref 3.5–5.1)
Sodium: 142 mmol/L (ref 135–145)

## 2016-09-27 LAB — BRAIN NATRIURETIC PEPTIDE: B Natriuretic Peptide: 316.2 pg/mL — ABNORMAL HIGH (ref 0.0–100.0)

## 2016-09-27 LAB — I-STAT TROPONIN, ED: Troponin i, poc: 0.01 ng/mL (ref 0.00–0.08)

## 2016-09-27 LAB — RETICULOCYTES
RBC.: 2.72 MIL/uL — AB (ref 3.87–5.11)
RETIC COUNT ABSOLUTE: 84.3 10*3/uL (ref 19.0–186.0)
RETIC CT PCT: 3.1 % (ref 0.4–3.1)

## 2016-09-27 LAB — GLUCOSE, CAPILLARY
Glucose-Capillary: 123 mg/dL — ABNORMAL HIGH (ref 65–99)
Glucose-Capillary: 84 mg/dL (ref 65–99)

## 2016-09-27 MED ORDER — CLONIDINE HCL 0.1 MG PO TABS
0.1000 mg | ORAL_TABLET | Freq: Every day | ORAL | Status: DC
  Filled 2016-09-27: qty 1

## 2016-09-27 MED ORDER — NITROGLYCERIN 0.4 MG SL SUBL: 0.4000 mg | SUBLINGUAL_TABLET | SUBLINGUAL | Status: DC | PRN

## 2016-09-27 MED ORDER — LOSARTAN POTASSIUM 50 MG PO TABS
100.0000 mg | ORAL_TABLET | Freq: Every day | ORAL | Status: DC
  Administered 2016-09-28 – 2016-09-29 (×2): 100 mg via ORAL
  Filled 2016-09-27 (×2): qty 2

## 2016-09-27 MED ORDER — GABAPENTIN 600 MG PO TABS: 600.0000 mg | ORAL_TABLET | Freq: Two times a day (BID) | ORAL | Status: DC

## 2016-09-27 MED ORDER — ADULT MULTIVITAMIN W/MINERALS CH
1.0000 | ORAL_TABLET | Freq: Every day | ORAL | Status: DC
  Administered 2016-09-28 – 2016-09-29 (×2): 1 via ORAL
  Filled 2016-09-27 (×3): qty 1

## 2016-09-27 MED ORDER — ASPIRIN EC 325 MG PO TBEC
325.0000 mg | DELAYED_RELEASE_TABLET | Freq: Every day | ORAL | Status: DC
  Administered 2016-09-27 – 2016-09-29 (×3): 325 mg via ORAL
  Filled 2016-09-27 (×3): qty 1

## 2016-09-27 MED ORDER — HYDRALAZINE HCL 20 MG/ML IJ SOLN
5.0000 mg | INTRAMUSCULAR | Status: DC | PRN
  Administered 2016-09-28: 5 mg via INTRAVENOUS
  Filled 2016-09-27 (×2): qty 1

## 2016-09-27 MED ORDER — ENOXAPARIN SODIUM 40 MG/0.4ML ~~LOC~~ SOLN: 40.0000 mg | SUBCUTANEOUS | Status: DC

## 2016-09-27 MED ORDER — HYDRALAZINE HCL 20 MG/ML IJ SOLN: 10.0000 mg | Freq: Once | INTRAMUSCULAR | Status: DC

## 2016-09-27 MED ORDER — OXYCODONE-ACETAMINOPHEN 5-325 MG PO TABS: 1.0000 | ORAL_TABLET | ORAL | Status: DC | PRN

## 2016-09-27 MED ORDER — INSULIN ASPART 100 UNIT/ML ~~LOC~~ SOLN
0.0000 [IU] | Freq: Three times a day (TID) | SUBCUTANEOUS | Status: DC
  Administered 2016-09-28: 2 [IU] via SUBCUTANEOUS
  Administered 2016-09-28: 3 [IU] via SUBCUTANEOUS
  Administered 2016-09-28: 1 [IU] via SUBCUTANEOUS
  Administered 2016-09-29: 2 [IU] via SUBCUTANEOUS
  Administered 2016-09-29: 3 [IU] via SUBCUTANEOUS
  Administered 2016-09-29: 5 [IU] via SUBCUTANEOUS

## 2016-09-27 MED ORDER — ATORVASTATIN CALCIUM 20 MG PO TABS
20.0000 mg | ORAL_TABLET | Freq: Every day | ORAL | Status: DC
  Administered 2016-09-27 – 2016-09-28 (×2): 20 mg via ORAL
  Filled 2016-09-27 (×2): qty 1

## 2016-09-27 MED ORDER — INSULIN ASPART 100 UNIT/ML ~~LOC~~ SOLN: 0.0000 [IU] | Freq: Every day | SUBCUTANEOUS | Status: DC

## 2016-09-27 MED ORDER — PANTOPRAZOLE SODIUM 40 MG PO TBEC
40.0000 mg | DELAYED_RELEASE_TABLET | Freq: Every day | ORAL | Status: DC
  Administered 2016-09-28 – 2016-09-29 (×2): 40 mg via ORAL
  Filled 2016-09-27 (×2): qty 1

## 2016-09-27 MED ORDER — GABAPENTIN 600 MG PO TABS
1200.0000 mg | ORAL_TABLET | Freq: Every day | ORAL | Status: DC
  Administered 2016-09-27 – 2016-09-28 (×2): 1200 mg via ORAL
  Filled 2016-09-27 (×2): qty 2

## 2016-09-27 MED ORDER — ZOLPIDEM TARTRATE 5 MG PO TABS: 5.0000 mg | ORAL_TABLET | Freq: Every evening | ORAL | Status: DC | PRN

## 2016-09-27 MED ORDER — ACETAMINOPHEN 325 MG PO TABS: 650.0000 mg | ORAL_TABLET | ORAL | Status: DC | PRN

## 2016-09-27 MED ORDER — HEPARIN (PORCINE) IN NACL 100-0.45 UNIT/ML-% IJ SOLN
1100.0000 [IU]/h | INTRAMUSCULAR | Status: DC
  Administered 2016-09-27: 1000 [IU]/h via INTRAVENOUS
  Administered 2016-09-28: 1100 [IU]/h via INTRAVENOUS
  Filled 2016-09-27 (×2): qty 250

## 2016-09-27 MED ORDER — HYDROCHLOROTHIAZIDE 25 MG PO TABS
25.0000 mg | ORAL_TABLET | Freq: Every day | ORAL | Status: DC
  Administered 2016-09-28 – 2016-09-29 (×2): 25 mg via ORAL
  Filled 2016-09-27 (×2): qty 1

## 2016-09-27 MED ORDER — GABAPENTIN 300 MG PO CAPS
600.0000 mg | ORAL_CAPSULE | Freq: Every day | ORAL | Status: DC
  Administered 2016-09-28 – 2016-09-29 (×2): 600 mg via ORAL
  Filled 2016-09-27 (×2): qty 2

## 2016-09-27 MED ORDER — HEPARIN BOLUS VIA INFUSION
3000.0000 [IU] | Freq: Once | INTRAVENOUS | Status: AC
  Administered 2016-09-27: 3000 [IU] via INTRAVENOUS
  Filled 2016-09-27: qty 3000

## 2016-09-27 MED ORDER — ONDANSETRON HCL 4 MG/2ML IJ SOLN: 4.0000 mg | Freq: Four times a day (QID) | INTRAMUSCULAR | Status: DC | PRN

## 2016-09-27 MED ORDER — LEVOTHYROXINE SODIUM 75 MCG PO TABS
75.0000 ug | ORAL_TABLET | Freq: Every day | ORAL | Status: DC
  Administered 2016-09-28 – 2016-09-29 (×2): 75 ug via ORAL
  Filled 2016-09-27 (×2): qty 1

## 2016-09-27 MED ORDER — ALPRAZOLAM 0.5 MG PO TABS: 1.0000 mg | ORAL_TABLET | ORAL | Status: DC | PRN

## 2016-09-27 MED ORDER — LORATADINE 10 MG PO TABS
10.0000 mg | ORAL_TABLET | Freq: Every day | ORAL | Status: DC
  Administered 2016-09-28 – 2016-09-29 (×2): 10 mg via ORAL
  Filled 2016-09-27 (×2): qty 1

## 2016-09-27 NOTE — ED Provider Notes (Signed)
[facility] DEPT Provider Note   CSN: [id number] Arrival date & time: 09/27/16  1737     History   Chief Complaint Chief Complaint  Patient presents with  . Chest Pain    HPI [name] is a 76 y.o. female who presents with 4 weeks of worsening intermittent left-sided chest pain and bradycardia. She also reports that she has been having some worsening dyspnea on exertion for the last 4 weeks. Patient reports that the episodes of chest pain or intermittent and denies any specific trigger. She describes it as a "sharp ache" and states that sometimes it radiates to her left upper extremity. Patient states that the length of these episodes varies. She says that they eventually resolve on their own. She has not been taking any medications for the pain at home. She denies any specific associated action. She has had some episodes at rest. Though she does say that they are worsened with movement. She denies any diaphoresis, nausea/vomiting when she has these episodes of chest pain. Patient states this is been an ongoing issue and she was seen by her primary care doctor for the symptoms. He arranged to have an outpatient echocardiogram a few weeks ago. Patient of the results with area shows an EF of 66%. Her primary care doctor is attempting to get her in with the cardiologist. Patient states there is no change or new symptoms today, but states that she came today because she does not feel like she can wait for her cardiologist referral. Grade 2 diastolic dysfunction but otherwise unremarkable. She has had some associated bilateral lower extremity edema that has been symmetric. She denies recent immobilization, prior history of DVT/PE, recent surgery, or long travel.   The history is provided by the patient.    Past Medical History:  Diagnosis Date  . Diabetes mellitus without complication ([place])   . GERD (gastroesophageal reflux disease)   . HLD (hyperlipidemia)   . Hypertension   .  Hypothyroidism     Patient Active Problem List   Diagnosis Date Noted  . Chest pain 09/27/2016  . Hypertensive urgency 09/27/2016  . Macrocytic anemia 09/27/2016  . Hypertension   . Diabetes mellitus without complication ([place])   . HLD (hyperlipidemia)   . GERD (gastroesophageal reflux disease)   . Hypothyroidism     Past Surgical History:  Procedure Laterality Date  . ABDOMINAL HYSTERECTOMY    . NECK SURGERY      OB History    No data available       Home Medications    Prior to Admission medications   Medication Sig Start Date End Date Taking? Authorizing Provider  ALPRAZolam Duanne Moron) 1 MG tablet Take 1 mg by mouth as needed for anxiety.   Yes [provider]  aspirin EC 81 MG tablet Take 81 mg by mouth daily.   Yes [provider]  atorvastatin (LIPITOR) 20 MG tablet Take 20 mg by mouth at bedtime.  10/05/13  Yes [provider]  cloNIDine (CATAPRES) 0.1 MG tablet Take 0.1 mg by mouth daily.   Yes [provider]  gabapentin (NEURONTIN) 600 MG tablet Take 600-1,200 mg by mouth 2 (two) times daily. Take 600 mg in the morning and 1200 mg at night 10/05/13  Yes [provider]  glimepiride (AMARYL) 1 MG tablet Take 4 mg by mouth daily. 10/05/13  Yes [provider]  levothyroxine (SYNTHROID, LEVOTHROID) 75 MCG tablet Take 75 mcg by mouth daily. 10/05/13  Yes [provider]  loratadine (CLARITIN) 10 MG tablet Take 10 mg by mouth daily.   Yes [provider]  losartan (COZAAR) 100 MG tablet Take 100 mg by mouth daily.   Yes [provider]  metFORMIN (GLUCOPHAGE-XR) 750 MG 24 hr tablet Take 1,500 mg by mouth daily. 10/05/13  Yes [provider]  Multiple Vitamin (MULTI-VITAMINS) TABS Take 1 tablet by mouth daily.   Yes [provider]  pantoprazole (PROTONIX) 40 MG tablet Take 40 mg by mouth daily. 10/05/13  Yes [provider]    Family History History reviewed. No  pertinent family history.  Social History Social History  Substance Use Topics  . Smoking status: Never Smoker  . Smokeless tobacco: Never Used  . Alcohol use No     Allergies   Iohexol; Morphine; and Codeine   Review of Systems Review of Systems  Constitutional: Negative for chills and fever.  HENT: Negative for sore throat.   Eyes: Negative for visual disturbance.  Respiratory: Positive for shortness of breath. Negative for cough.   Cardiovascular: Positive for chest pain and leg swelling (bilateral).  Gastrointestinal: Negative for abdominal pain, diarrhea, nausea and vomiting.  Genitourinary: Negative for dysuria and hematuria.  Skin: Negative for rash.  Neurological: Negative for dizziness, weakness, numbness and headaches.  All other systems reviewed and are negative.    Physical Exam Updated Vital Signs BP (!) 186/64   Pulse (!) 49   Temp 98.2 F (36.8 C) (Oral)   Resp 20   Ht 5' (1.524 m)   Wt 88 kg (194 lb)   SpO2 96%   BMI 37.89 kg/m   Physical Exam  Constitutional: She is oriented to person, place, and time. She appears well-developed and well-nourished.  Elderly appearing  HENT:  Head: Normocephalic and atraumatic.  Mouth/Throat: Oropharynx is clear and moist and mucous membranes are normal.  Eyes: Conjunctivae, EOM and lids are normal. Pupils are equal, round, and reactive to light.  Neck: Full passive range of motion without pain.  Cardiovascular: Regular rhythm, normal heart sounds and normal pulses.  Bradycardia present.  Exam reveals no gallop and no friction rub.   No murmur heard. Pulses:      Radial pulses are 2+ on the right side, and 2+ on the left side.       Dorsalis pedis pulses are 2+ on the right side, and 2+ on the left side.  Pulmonary/Chest: Effort normal and breath sounds normal.  No tenderness to palpation of chest wall. No evidence of respiratory distress. Able to speak in full sentences without difficulty.  Abdominal: Soft.  Normal appearance. There is no tenderness. There is no rigidity and no guarding.  Musculoskeletal: Normal range of motion.  Neurological: She is alert and oriented to person, place, and time.  Skin: Skin is warm and dry. Capillary refill takes less than 2 seconds.  Psychiatric: She has a normal mood and affect. Her speech is normal.  Nursing note and vitals reviewed.    ED Treatments / Results  Labs (all labs ordered are listed, but only abnormal results are displayed) Labs Reviewed  BASIC METABOLIC PANEL - Abnormal; Notable for the following:       Result Value   Glucose, Bld 122 (*)    Calcium 8.5 (*)    All other components within normal limits  CBC - Abnormal; Notable for the following:    RBC 2.65 (*)    Hemoglobin 8.4 (*)    HCT 27.0 (*)    MCV 101.9 (*)  RDW 19.2 (*)    All other components within normal limits  BRAIN NATRIURETIC PEPTIDE  I-STAT TROPOININ, ED    EKG  EKG Interpretation  Date/Time:  Thursday September 27 2016 17:44:22 EDT Ventricular Rate:  50 PR Interval:  170 QRS Duration: 94 QT Interval:  474 QTC Calculation: 432 R Axis:   100 Text Interpretation:  Sinus bradycardia Rightward axis RSR' or QR pattern in V1 suggests right ventricular conduction delay Borderline ECG No significant change since last tracing Confirmed by Duffy Bruce [phone]) on 09/27/2016 7:43:35 PM       Radiology Dg Chest 2 View  Result Date: 09/27/2016 CLINICAL DATA:  Chest pain for 3 weeks. EXAM: CHEST  2 VIEW COMPARISON:  November 02, 2013 FINDINGS: The heart size and mediastinal contours are stable. The heart size is enlarged. Both lungs are clear. The visualized skeletal structures are unremarkable. IMPRESSION: No active cardiopulmonary disease. Electronically Signed   By: Abelardo Diesel M.D.   On: 09/27/2016 18:51    Procedures Procedures (including critical care time)  Medications Ordered in ED Medications  hydrALAZINE (APRESOLINE) injection 10 mg (not administered)      Initial Impression / Assessment and Plan / ED Course  I have reviewed the triage vital signs and the nursing notes.  Pertinent labs & imaging results that were available during my care of the patient were reviewed by me and considered in my medical decision making (see chart for details).     76 year old female who presents with 4 weeks of intermittent left-sided chest pain, bradycardia, dyspnea on exertion. Seen by her primary care doctor for evaluation of symptoms. An outpatient echo done on 09/24/16 that showed new-onset grade 2 diastolic dysfunction and an EF of 66%. Patient and daughter state that primary care doctor told her that she needed to be evaluated by a cardiologist but has not made a referral to cardiology at. Daughter brought patient in today because symptoms were getting more frequent and more severe and daughter was concerned because "she feels like her waiting for something." Patient is not currently having any pain in the emergency department. Offered analgesics but patient declined at this time since she is pain-free. Patient is bradycardic on physical exam. Throughout interview she continuously drops down into the heart rate of 40. Consider angina versus new onset heart failure versus ACS versus. History/physical examination concerning for PE. Patient has a heart score of 4 based on history/physical exam and risk factors.  Initial labs and imaging ordered at triage, including CBC, BMP, troponin, EKG, chest x-ray. BNP added for additional evaluation.  Labs and imaging reviewed. Initial troponin is negative. Chest x-ray negative for any acute infectious etiology.EKG shows possible right ventricular conduction delay. This is old compared to her EKG done in 2012. CBC shows anemia. Discussed with daughter, and patient has a history of anemia and this is not a new problem. Initial troponin is negative. Initial BMP shows elevated blood glucose but otherwise unremarkable. BNP is  pending.  Given that patient has had a significant history of pain and is bradycardic in the emergency department and the fact that she has risk factors and has a heart score 4, she would probably likely benefit from an inpatient admission for further evaluation and workup with cardiology.  Hospitalist consult.  Discussed with hospitalist. Will plan to admit.    Final Clinical Impressions(s) / ED Diagnoses   Final diagnoses:  Essential hypertension  Chest pain, unspecified type  Diabetes mellitus without complication (HCC)  Hyperlipidemia, unspecified  hyperlipidemia type  Gastroesophageal reflux disease without esophagitis  Hypothyroidism, unspecified type    New Prescriptions New Prescriptions   No medications on file     Desma Mcgregor 09/27/16 2053    Duffy Bruce, MD 09/29/16 1351

## 2016-09-27 NOTE — Progress Notes (Signed)
Patient received from ED and oriented to room. Daughter present at bedside. Skin assessment and tele monitoring complete. Patient's bs was 84.  Given a [place] sandwich and sprite. Will recheck bs and blood pressure once patient has finished eating and more relaxed. Call light within reach.

## 2016-09-27 NOTE — ED Notes (Signed)
Report attempted x 1

## 2016-09-27 NOTE — H&P (Addendum)
History and Physical    [name] DZH:[id number] DOB: 1940/07/27 DOA: 09/27/2016  Referring MD/NP/PA:   PCP: System, Provider Not In   Patient coming from:  The patient is coming from home.  At baseline, pt is independent for most of ADL.   Chief Complaint: chest pain  HPI: [name] is a 76 y.o. female with medical history significant of hypertension, hyperlipidemia, diabetes mellitus, GERD, hypothyroidism, slightly, who presents with chest pain.  Patient states that she has been having intermittent chest pain for almost 4 weeks. It is located in the substernal area and left side of chest, pressure-like, radiating to jaw and the left arm. Today her chest pain has worsened. She had 2 episodes of chest pain, each time lasted for about 20-30 minutes, and resolved spontaneously. It is associated with shortness breath on exertion. She has dry cough, but no fever or chills. Patient does not have tenderness in the calf areas. Pt was seen by her PCP and had 2d echo on 09/24/16, which showed EF 66% with grade 2 diastolic dysfunction. She also has mild bilateral leg edema. Patient denies nausea, vomiting, diarrhea, abdominal pain, symptoms of UTI or unilateral weakness. No hematuria or hematochezia. Pt states the she was taking Bisoprolol/HCTZ pill for HTN. Due to bradycardia, she stopped taking it today as instructed by her PCP.  ED Course: pt was found to have elevated blood pressure 192/65, negative troponin, WBC 6.0, hemoglobin 8.4 which was 12.48/9/12, creatinine normal, temperature normal, bradycardia with heart rates at 40-50s, negative chest x-ray for acute abnormalities. Patient is placed on telemetry bed for observation.  Review of Systems:   General: no fevers, chills, has poor appetite, has fatigue HEENT: no blurry vision, hearing changes or sore throat Respiratory: has dyspnea, coughing, no wheezing CV: has chest pain, no palpitations GI: no nausea, vomiting, abdominal  pain, diarrhea, constipation GU: no dysuria, burning on urination, increased urinary frequency, hematuria  Ext: has leg edema Neuro: no unilateral weakness, numbness, or tingling, no vision change or hearing loss Skin: no rash, no skin tear. MSK: No muscle spasm, no deformity, no limitation of range of movement in spin Heme: No easy bruising.  Travel history: No recent long distant travel.  Allergy:  Allergies  Allergen Reactions  . Iohexol Shortness Of Breath     Desc: pt had cm years ago and was unable to breath after given cm.  stephanie, rt-r, Onset Date: [id number]   . Morphine Anxiety    "gets all crazy"  . Codeine Rash    Past Medical History:  Diagnosis Date  . Diabetes mellitus without complication ([place])   . GERD (gastroesophageal reflux disease)   . HLD (hyperlipidemia)   . Hypertension   . Hypothyroidism     Past Surgical History:  Procedure Laterality Date  . ABDOMINAL HYSTERECTOMY    . NECK SURGERY      Social History:  reports that she has never smoked. She has never used smokeless tobacco. She reports that she does not drink alcohol or use drugs.  Family History:  Family History  Problem Relation Age of Onset  . Cerebral aneurysm Mother   . Hypertension Mother   . Pancreatic cancer Father   . Cancer Brother        Not know which type of cancer     Prior to Admission medications   Medication Sig Start Date End Date Taking? Authorizing Provider  ALPRAZolam Duanne Moron) 1 MG tablet Take 1 mg by mouth as needed for  anxiety.   Yes [provider]  aspirin EC 81 MG tablet Take 81 mg by mouth daily.   Yes [provider]  atorvastatin (LIPITOR) 20 MG tablet Take 20 mg by mouth at bedtime.  10/05/13  Yes [provider]  cloNIDine (CATAPRES) 0.1 MG tablet Take 0.1 mg by mouth daily.   Yes [provider]  gabapentin (NEURONTIN) 600 MG tablet Take 600-1,200 mg by mouth 2 (two) times daily. Take 600 mg in the morning and 1200 mg at  night 10/05/13  Yes [provider]  glimepiride (AMARYL) 1 MG tablet Take 4 mg by mouth daily. 10/05/13  Yes [provider]  levothyroxine (SYNTHROID, LEVOTHROID) 75 MCG tablet Take 75 mcg by mouth daily. 10/05/13  Yes [provider]  loratadine (CLARITIN) 10 MG tablet Take 10 mg by mouth daily.   Yes [provider]  losartan (COZAAR) 100 MG tablet Take 100 mg by mouth daily.   Yes [provider]  metFORMIN (GLUCOPHAGE-XR) 750 MG 24 hr tablet Take 1,500 mg by mouth daily. 10/05/13  Yes [provider]  Multiple Vitamin (MULTI-VITAMINS) TABS Take 1 tablet by mouth daily.   Yes [provider]  pantoprazole (PROTONIX) 40 MG tablet Take 40 mg by mouth daily. 10/05/13  Yes [provider]    Physical Exam: Vitals:   09/27/16 2130 09/27/16 2145 09/27/16 2219 09/27/16 2315  BP: (!) 157/60 112/83 (!) 197/45 (!) 180/44  Pulse: (!) 44 89 (!) 49 (!) 49  Resp: 19 19 20 16   Temp:   98.3 F (36.8 C)   TempSrc:   Oral   SpO2: 92% 97% 95%   Weight:   83.1 kg (183 lb 1.6 oz)   Height:   5' (1.524 m)    General: Not in acute distress HEENT:       Eyes: PERRL, EOMI, no scleral icterus.       ENT: No discharge from the ears and nose, no pharynx injection, no tonsillar enlargement.        Neck: No JVD, no bruit, no mass felt. Heme: No neck lymph node enlargement. Cardiac: S1/S2, RRR, No murmurs, No gallops or rubs. Respiratory: No rales, wheezing, rhonchi or rubs. GI: Soft, nondistended, nontender, no rebound pain, no organomegaly, BS present. GU: No hematuria Ext: 1+ pitting leg edema bilaterally. 2+DP/PT pulse bilaterally. Musculoskeletal: No joint deformities, No joint redness or warmth, no limitation of ROM in spin. Skin: No rashes.  Neuro: Alert, oriented X3, cranial nerves II-XII grossly intact, moves all extremities normally.  Psych: Patient is not psychotic, no suicidal or hemocidal ideation.  Labs on Admission: I have  personally reviewed following labs and imaging studies  CBC:  Recent Labs Lab 09/27/16 1758  WBC 6.0  HGB 8.4*  HCT 27.0*  MCV 101.9*  PLT 681   Basic Metabolic Panel:  Recent Labs Lab 09/27/16 1758  NA 142  K 3.8  CL 106  CO2 27  GLUCOSE 122*  BUN 10  CREATININE 0.89  CALCIUM 8.5*   GFR: Estimated Creatinine Clearance: 53 mL/min (by C-G formula based on SCr of 0.89 mg/dL). Liver Function Tests: No results for input(s): AST, ALT, ALKPHOS, BILITOT, PROT, ALBUMIN in the last 168 hours. No results for input(s): LIPASE, AMYLASE in the last 168 hours. No results for input(s): AMMONIA in the last 168 hours. Coagulation Profile: No results for input(s): INR, PROTIME in the last 168 hours. Cardiac Enzymes:  Recent Labs Lab 09/27/16 2238  TROPONINI <0.03  BNP (last 3 results) No results for input(s): PROBNP in the last 8760 hours. HbA1C: No results for input(s): HGBA1C in the last 72 hours. CBG:  Recent Labs Lab 09/27/16 2239 09/27/16 2307  GLUCAP 84 123*   Lipid Profile: No results for input(s): CHOL, HDL, LDLCALC, TRIG, CHOLHDL, LDLDIRECT in the last 72 hours. Thyroid Function Tests: No results for input(s): TSH, T4TOTAL, FREET4, T3FREE, THYROIDAB in the last 72 hours. Anemia Panel:  Recent Labs  09/27/16 2238  VITAMINB12 270  FOLATE 43.8  FERRITIN 139  TIBC 332  IRON 49  RETICCTPCT 3.1   Urine analysis:    Component Value Date/Time   COLORURINE YELLOW 10/10/2006 2144   APPEARANCEUR CLEAR 10/10/2006 2144   LABSPEC 1.013 10/10/2006 2144   PHURINE 6.0 10/10/2006 2144   GLUCOSEU NEGATIVE 10/10/2006 2144   HGBUR NEGATIVE 10/10/2006 2144   BILIRUBINUR NEGATIVE 10/10/2006 2144   [employer] NEGATIVE 10/10/2006 2144   PROTEINUR NEGATIVE 10/10/2006 2144   UROBILINOGEN 0.2 10/10/2006 2144   NITRITE NEGATIVE 10/10/2006 2144   LEUKOCYTESUR  10/10/2006 2144    NEGATIVE MICROSCOPIC NOT DONE ON URINES WITH NEGATIVE PROTEIN, BLOOD, LEUKOCYTES, NITRITE, OR  GLUCOSE <1000 mg/dL.   Sepsis Labs: @LABRCNTIP (procalcitonin:4,lacticidven:4) )No results found for this or any previous visit (from the past 240 hour(s)).   Radiological Exams on Admission: Dg Chest 2 View  Result Date: 09/27/2016 CLINICAL DATA:  Chest pain for 3 weeks. EXAM: CHEST  2 VIEW COMPARISON:  November 02, 2013 FINDINGS: The heart size and mediastinal contours are stable. The heart size is enlarged. Both lungs are clear. The visualized skeletal structures are unremarkable. IMPRESSION: No active cardiopulmonary disease. Electronically Signed   By: Abelardo Diesel M.D.   On: 09/27/2016 18:51     EKG: Independently reviewed.  Sinus rhythm, bradycardia, QTC 432, low voltage, mild T-wave inversion in V1-V2.  Assessment/Plan Principal Problem:   Chest pain Active Problems:   Hypertension   Diabetes mellitus without complication (HCC)   HLD (hyperlipidemia)   GERD (gastroesophageal reflux disease)   Hypothyroidism   Hypertensive urgency   Macrocytic anemia   Acute on chronic diastolic CHF (congestive heart failure) (HCC)  Chest pain: Patient has typical chest pain, which has been going on for almost 4 weeks, and worsened today, concerning for unstable angina Initial troponin negative. Pt may also have demand ischemia component given elevated blood pressure. EKG showed low voltage, mild T-wave inversion and bradycardia.  - will place on Tele bed for obs - start IV heparin - cycle CE q6 x3 and repeat EKG in the am  - prn Nitroglycerin, and aspirin, lipitor  - prn percocet for pain (pt is allergic to morphine) - Risk factor stratification: will check FLP and A1C  - 2d echo - please call Card in AM  Hypertensive urgency: bp 192/65. -Hold bisoprolol due to bradycardia -Continue HCTZ, amlodipine, losartan -IV hydralazine when necessary  Acute on chronic diastolic CHF: pt has SOB, leg edema and elevated BNP 316, consistent with dCHF exacerbation. -will start IV lasix 40 mg  daily -F/u 2d echo.  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 Patient is taking metformin and Amaryl at home -SSI  -Check A1c  HLD: Last LDL was 111 on 10/10/06, not at goal (<100 or ideally <70). -Continue home medications: lipitor -Check FLP  GERD: -Protonix  Hypothyroidism: Last TSH was 1.651 on 10/09/06 -Continue home Synthroid -Check TSH  Macrocytic anemia: Hemoglobin 8.4 which was 12.4 on 11/23/10. MCV 101.9. Denies hematuria or hematochezia. -check FOBT -check anemia  panel    DVT ppx: on IV heparin Code Status: Full code Family Communication: Yes, patient's daughter and son-in law at bed side Disposition Plan:  Anticipate discharge back to previous home environment Consults called:  none Admission status: Obs / tele     Date of Service 09/28/2016    Ivor Costa [facility] Pager [phone]  If 7PM-7AM, please contact night-coverage [email or web address] Password [email or web address] 09/28/2016, 1:23 AM

## 2016-09-27 NOTE — ED Notes (Signed)
Patient brought to room from [facility]

## 2016-09-27 NOTE — Progress Notes (Signed)
ANTICOAGULATION CONSULT NOTE - Initial Consult  Pharmacy Consult for Heparin Indication: chest pain/ACS  Allergies  Allergen Reactions  . Iohexol Shortness Of Breath     Desc: pt had cm years ago and was unable to breath after given cm.  stephanie, rt-r, Onset Date: [id number]   . Morphine Shortness Of Breath  . Codeine Palpitations    Patient Measurements: Height: 5' (152.4 cm) Weight: 194 lb (88 kg) IBW/kg (Calculated) : 45.5 Heparin Dosing Weight: 68 kg  Vital Signs: Temp: 98.2 F (36.8 C) (06/14 1749) Temp Source: Oral (06/14 1749) BP: 174/49 (06/14 2030) Pulse Rate: 29 (06/14 2030)  Labs:  Recent Labs  09/27/16 1758  HGB 8.4*  HCT 27.0*  PLT 305  CREATININE 0.89    Estimated Creatinine Clearance: 54.7 mL/min (by C-G formula based on SCr of 0.89 mg/dL).   Medical History: Past Medical History:  Diagnosis Date  . Diabetes mellitus without complication ([place])   . GERD (gastroesophageal reflux disease)   . HLD (hyperlipidemia)   . Hypertension   . Hypothyroidism     Assessment: 76 year old female to begin heparin for chest pain  Goal of Therapy:  Heparin level 0.3-0.7 units/ml Monitor platelets by anticoagulation protocol: Yes   Plan:  Heparin 3000 units iv bolus x 1  Heparin drip at 1000 units / hr Daily heparin level, CBC  Thank you Anette Guarneri, PharmD [phone]  Tad Moore 09/27/2016,9:09 PM

## 2016-09-27 NOTE — ED Triage Notes (Signed)
Pt endorses left sided CP with radiation to the jaw and left arm x 3 weeks. Pt sinus brady at 49 bpm in triage. Pt has been followed on this issue by her PCP but has not seen a cardiologist. Pt had echo done recently and told it ws normal.

## 2016-09-28 ENCOUNTER — Other Ambulatory Visit (HOSPITAL_COMMUNITY): Payer: Medicare HMO

## 2016-09-28 ENCOUNTER — Encounter (HOSPITAL_COMMUNITY): Payer: Self-pay | Admitting: Student

## 2016-09-28 DIAGNOSIS — Z885 Allergy status to narcotic agent status: Secondary | ICD-10-CM | POA: Diagnosis not present

## 2016-09-28 DIAGNOSIS — I160 Hypertensive urgency: Principal | ICD-10-CM

## 2016-09-28 DIAGNOSIS — I5033 Acute on chronic diastolic (congestive) heart failure: Secondary | ICD-10-CM | POA: Diagnosis present

## 2016-09-28 DIAGNOSIS — Z7982 Long term (current) use of aspirin: Secondary | ICD-10-CM | POA: Diagnosis not present

## 2016-09-28 DIAGNOSIS — I10 Essential (primary) hypertension: Secondary | ICD-10-CM | POA: Diagnosis not present

## 2016-09-28 DIAGNOSIS — K219 Gastro-esophageal reflux disease without esophagitis: Secondary | ICD-10-CM

## 2016-09-28 DIAGNOSIS — E039 Hypothyroidism, unspecified: Secondary | ICD-10-CM | POA: Diagnosis present

## 2016-09-28 DIAGNOSIS — Z800 Family history of malignant neoplasm of digestive organs: Secondary | ICD-10-CM | POA: Diagnosis not present

## 2016-09-28 DIAGNOSIS — R079 Chest pain, unspecified: Secondary | ICD-10-CM | POA: Diagnosis present

## 2016-09-28 DIAGNOSIS — Z888 Allergy status to other drugs, medicaments and biological substances status: Secondary | ICD-10-CM | POA: Diagnosis not present

## 2016-09-28 DIAGNOSIS — R0609 Other forms of dyspnea: Secondary | ICD-10-CM | POA: Diagnosis not present

## 2016-09-28 DIAGNOSIS — E119 Type 2 diabetes mellitus without complications: Secondary | ICD-10-CM

## 2016-09-28 DIAGNOSIS — D539 Nutritional anemia, unspecified: Secondary | ICD-10-CM | POA: Diagnosis present

## 2016-09-28 DIAGNOSIS — Z8249 Family history of ischemic heart disease and other diseases of the circulatory system: Secondary | ICD-10-CM | POA: Diagnosis not present

## 2016-09-28 DIAGNOSIS — Z90710 Acquired absence of both cervix and uterus: Secondary | ICD-10-CM | POA: Diagnosis not present

## 2016-09-28 DIAGNOSIS — I208 Other forms of angina pectoris: Secondary | ICD-10-CM | POA: Diagnosis not present

## 2016-09-28 DIAGNOSIS — E785 Hyperlipidemia, unspecified: Secondary | ICD-10-CM | POA: Diagnosis present

## 2016-09-28 DIAGNOSIS — E7800 Pure hypercholesterolemia, unspecified: Secondary | ICD-10-CM | POA: Diagnosis not present

## 2016-09-28 DIAGNOSIS — I110 Hypertensive heart disease with heart failure: Secondary | ICD-10-CM | POA: Diagnosis present

## 2016-09-28 LAB — TROPONIN I
Troponin I: <0.03 ng/mL (ref <0.03)
Troponin I: <0.03 ng/mL (ref <0.03)

## 2016-09-28 LAB — CBC
HCT: 27.7 % — ABNORMAL LOW (ref 36.0–46.0)
Hemoglobin: 8.7 g/dL — ABNORMAL LOW (ref 12.0–15.0)
MCH: 31.4 pg (ref 26.0–34.0)
MCHC: 31.4 g/dL (ref 30.0–36.0)
MCV: 100 fL (ref 78.0–100.0)
Platelets: 296 10*3/uL (ref 150–400)
RBC: 2.77 MIL/uL — AB (ref 3.87–5.11)
RDW: 19.1 % — AB (ref 11.5–15.5)
WBC: 5.6 10*3/uL (ref 4.0–10.5)

## 2016-09-28 LAB — LIPID PANEL
CHOLESTEROL: 121 mg/dL (ref 0–200)
HDL: 26 mg/dL — ABNORMAL LOW (ref >40)
LDL Cholesterol: 67 mg/dL (ref 0–99)
TRIGLYCERIDES: 142 mg/dL (ref <150)
Total CHOL/HDL Ratio: 4.7 RATIO
VLDL: 28 mg/dL (ref 0–40)

## 2016-09-28 LAB — IRON AND TIBC
Iron: 49 ug/dL (ref 28–170)
SATURATION RATIOS: 15 % (ref 10.4–31.8)
TIBC: 332 ug/dL (ref 250–450)
UIBC: 283 ug/dL

## 2016-09-28 LAB — BASIC METABOLIC PANEL
ANION GAP: 12 (ref 5–15)
BUN: 10 mg/dL (ref 6–20)
CALCIUM: 8.9 mg/dL (ref 8.9–10.3)
CO2: 28 mmol/L (ref 22–32)
Chloride: 102 mmol/L (ref 101–111)
Creatinine, Ser: 0.88 mg/dL (ref 0.44–1.00)
GFR calc Af Amer: >60 mL/min (ref >60)
GFR calc non Af Amer: >60 mL/min (ref >60)
Glucose, Bld: 128 mg/dL — ABNORMAL HIGH (ref 65–99)
Potassium: 3.5 mmol/L (ref 3.5–5.1)
Sodium: 142 mmol/L (ref 135–145)

## 2016-09-28 LAB — VITAMIN B12: Vitamin B-12: 270 pg/mL (ref 180–914)

## 2016-09-28 LAB — GLUCOSE, CAPILLARY
GLUCOSE-CAPILLARY: 219 mg/dL — AB (ref 65–99)
Glucose-Capillary: 143 mg/dL — ABNORMAL HIGH (ref 65–99)
Glucose-Capillary: 162 mg/dL — ABNORMAL HIGH (ref 65–99)
Glucose-Capillary: 135 mg/dL — ABNORMAL HIGH (ref 65–99)

## 2016-09-28 LAB — FERRITIN: FERRITIN: 139 ng/mL (ref 11–307)

## 2016-09-28 LAB — HEPARIN LEVEL (UNFRACTIONATED)
HEPARIN UNFRACTIONATED: 0.32 [IU]/mL (ref 0.30–0.70)
Heparin Unfractionated: 0.31 IU/mL (ref 0.30–0.70)

## 2016-09-28 LAB — FOLATE: Folate: 43.8 ng/mL (ref >5.9)

## 2016-09-28 LAB — TSH: TSH: 2.743 u[IU]/mL (ref 0.350–4.500)

## 2016-09-28 MED ORDER — CLONIDINE HCL 0.1 MG PO TABS
0.1000 mg | ORAL_TABLET | Freq: Two times a day (BID) | ORAL | Status: DC
  Administered 2016-09-28: 0.1 mg via ORAL

## 2016-09-28 MED ORDER — FUROSEMIDE 10 MG/ML IJ SOLN
40.0000 mg | Freq: Every day | INTRAMUSCULAR | Status: DC
  Administered 2016-09-28 – 2016-09-29 (×2): 40 mg via INTRAVENOUS
  Filled 2016-09-28 (×3): qty 4

## 2016-09-28 MED ORDER — FUROSEMIDE 10 MG/ML IJ SOLN: 40.0000 mg | Freq: Every day | INTRAMUSCULAR | Status: DC

## 2016-09-28 MED ORDER — AMLODIPINE BESYLATE 5 MG PO TABS
5.0000 mg | ORAL_TABLET | Freq: Every day | ORAL | Status: DC
  Administered 2016-09-28 – 2016-09-29 (×2): 5 mg via ORAL
  Filled 2016-09-28 (×2): qty 1

## 2016-09-28 NOTE — Progress Notes (Signed)
PROGRESS NOTE    [name]  IRW:[id number] DOB: 1941-03-18 DOA: 09/27/2016 PCP: System, Provider Not In    Brief Narrative:   [name] is a 76 y.o. female with history of hypertension, hyperlipidemia, diabetes, hypothyroidism presented to the ED with intermittent chest pain 4 weeks pressure-like sensation radiating to the left upper extremity and jaw. Concern for unstable angina. Patient also noted to have hypertensive urgency. Patient admitted for ACS workup.    Assessment & Plan:   Principal Problem:   Chest pain Active Problems:   Hypertension   Diabetes mellitus without complication (HCC)   HLD (hyperlipidemia)   GERD (gastroesophageal reflux disease)   Hypothyroidism   Hypertensive urgency   Macrocytic anemia   Acute on chronic diastolic CHF (congestive heart failure) ([place])  #1 chest pain Questionable etiology. Patient with multiple cardiac risk factors presented with chest pain or shortness of breath. Cardiac enzymes negative 3. Fasting lipid panel with LDL of 67. 2-D echo pending. Continue aspirin, Lipitor, Lasix, Cozaar. Due to patient's multiple cardiac risk factors and concern for unstable angina will consulted cardiology for further evaluation and management.  #2 hypertensive urgency Patient noted to have systolic blood pressures in the 200s this morning. Blood pressure will need to be checked manually. Continue Norvasc, Cozaar, HCTZ, on IV Lasix. Follow.  #3 acute on chronic diastolic heart failure Patient noted to be volume overloaded on admission with some lower extremity edema. BNP elevated at 316. Patient on IV Lasix. Cardiac enzymes negative 3. Patient with a urine output of 2.8 L over the past 24 hours and is -4.4 L during this hospitalization. 2-D echo pending. Cardiology consultation pending.  #4 hypothyroidism Continue Synthroid.  #5  diabetes mellitus type 2  cbg 123 - 162. SSI  #6 GERD PPI  #7 hyperlipidemia Continue  Lipitor.    DVT prophylaxis: Heparin Code Status: Full Family Communication: Updated patient and family at bedside. Disposition Plan: Home once cardiac workup is completed and per cardiology.   Consultants:   Cardiology: Pending  Procedures:   Chest x-ray 09/27/2016  Antimicrobials:   None   Subjective: Patient denies any active chest pain. Patient states shortness of breath on minimal exertion. No nausea or vomiting.  Objective: Vitals:   09/27/16 2219 09/27/16 2315 09/28/16 0403 09/28/16 1018  BP: (!) 197/45 (!) 180/44 (!) 181/61 (!) 217/52  Pulse: (!) 49 (!) 49 62   Resp: 20 16 18    Temp: 98.3 F (36.8 C)  98.1 F (36.7 C)   TempSrc: Oral  Oral   SpO2: 95%  94%   Weight: 83.1 kg (183 lb 1.6 oz)     Height: 5' (1.524 m)       Intake/Output Summary (Last 24 hours) at 09/28/16 1101 Last data filed at 09/28/16 1015  Gross per 24 hour  Intake                0 ml  Output             4400 ml  Net            -4400 ml   Filed Weights   09/27/16 1750 09/27/16 2219  Weight: 88 kg (194 lb) 83.1 kg (183 lb 1.6 oz)    Examination:  General exam: Appears calm and comfortable  Respiratory system: Clear to auscultation. Respiratory effort normal. Cardiovascular system: S1 & S2 heard, RRR. No JVD, murmurs, rubs, gallops or clicks. No pedal edema. Gastrointestinal system: Abdomen is obese, mildly distended, soft  and nontender. No organomegaly or masses felt. Normal bowel sounds heard. Central nervous system: Alert and oriented. No focal neurological deficits. Extremities: Symmetric 5 x 5 power. Skin: No rashes, lesions or ulcers Psychiatry: Judgement and insight appear normal. Mood & affect appropriate.     Data Reviewed: I have personally reviewed following labs and imaging studies  CBC:  Recent Labs Lab 09/27/16 1758 09/28/16 0843  WBC 6.0 5.6  HGB 8.4* 8.7*  HCT 27.0* 27.7*  MCV 101.9* 100.0  PLT 305 956   Basic Metabolic Panel:  Recent Labs Lab  09/27/16 1758 09/28/16 0843  NA 142 142  K 3.8 3.5  CL 106 102  CO2 27 28  GLUCOSE 122* 128*  BUN 10 10  CREATININE 0.89 0.88  CALCIUM 8.5* 8.9   GFR: Estimated Creatinine Clearance: 53.6 mL/min (by C-G formula based on SCr of 0.88 mg/dL). Liver Function Tests: No results for input(s): AST, ALT, ALKPHOS, BILITOT, PROT, ALBUMIN in the last 168 hours. No results for input(s): LIPASE, AMYLASE in the last 168 hours. No results for input(s): AMMONIA in the last 168 hours. Coagulation Profile: No results for input(s): INR, PROTIME in the last 168 hours. Cardiac Enzymes:  Recent Labs Lab 09/27/16 2238 09/28/16 0245 09/28/16 0843  TROPONINI <0.03 <0.03 <0.03   BNP (last 3 results) No results for input(s): PROBNP in the last 8760 hours. HbA1C: No results for input(s): HGBA1C in the last 72 hours. CBG:  Recent Labs Lab 09/27/16 2239 09/27/16 2307 09/28/16 0551  GLUCAP 84 123* 143*   Lipid Profile:  Recent Labs  09/28/16 0245  CHOL 121  HDL 26*  LDLCALC 67  TRIG 142  CHOLHDL 4.7   Thyroid Function Tests:  Recent Labs  09/28/16 0245  TSH 2.743   Anemia Panel:  Recent Labs  09/27/16 2238  VITAMINB12 270  FOLATE 43.8  FERRITIN 139  TIBC 332  IRON 49  RETICCTPCT 3.1   Sepsis Labs: No results for input(s): PROCALCITON, LATICACIDVEN in the last 168 hours.  No results found for this or any previous visit (from the past 240 hour(s)).       Radiology Studies: Dg Chest 2 View  Result Date: 09/27/2016 CLINICAL DATA:  Chest pain for 3 weeks. EXAM: CHEST  2 VIEW COMPARISON:  November 02, 2013 FINDINGS: The heart size and mediastinal contours are stable. The heart size is enlarged. Both lungs are clear. The visualized skeletal structures are unremarkable. IMPRESSION: No active cardiopulmonary disease. Electronically Signed   By: Abelardo Diesel M.D.   On: 09/27/2016 18:51        Scheduled Meds: . aspirin EC  325 mg Oral Daily  . atorvastatin  20 mg Oral  QHS  . cloNIDine  0.1 mg Oral BID  . furosemide  40 mg Intravenous Daily  . gabapentin  600 mg Oral Daily  . gabapentin  1,200 mg Oral QHS  . hydrochlorothiazide  25 mg Oral Daily  . insulin aspart  0-5 Units Subcutaneous QHS  . insulin aspart  0-9 Units Subcutaneous TID WC  . levothyroxine  75 mcg Oral QAC breakfast  . loratadine  10 mg Oral Daily  . losartan  100 mg Oral Daily  . multivitamin with minerals  1 tablet Oral Daily  . pantoprazole  40 mg Oral Daily   Continuous Infusions: . heparin 1,000 Units/hr (09/27/16 2147)     LOS: 0 days    Time spent: [place], MD [facility] Pager [phone]  If 7PM-7AM, please contact night-coverage [email or web address] Password [email or web address] 09/28/2016, 11:01 AM

## 2016-09-28 NOTE — Progress Notes (Signed)
ANTICOAGULATION CONSULT NOTE - Follow Up Consult  Pharmacy Consult for heparin Indication: chest pain/ACS  Labs:  Recent Labs  09/27/16 1758 09/27/16 2238 09/28/16 0245  HGB 8.4*  --   --   HCT 27.0*  --   --   PLT 305  --   --   HEPARINUNFRC  --   --  0.32  CREATININE 0.89  --   --   TROPONINI  --  <0.03 <0.03    Assessment/Plan:  76yo female therapeutic on heparin with initial dosing for CP. Will continue gtt at current rate and confirm stable with additional level.   Wynona Neat, PharmD, BCPS  09/28/2016,3:58 AM

## 2016-09-28 NOTE — Care Management Obs Status (Signed)
[place] NOTIFICATION   Patient Details  Name: [name] MRN: [id number] Date of Birth: 05-04-1940   Medicare Observation Status Notification Given:  Yes    Dawayne Patricia, RN 09/28/2016, 12:03 PM

## 2016-09-28 NOTE — Progress Notes (Signed)
ANTICOAGULATION CONSULT NOTE - Follow Up Consult  Pharmacy Consult for Heparin Indication: chest pain/ACS  Allergies  Allergen Reactions  . Iohexol Shortness Of Breath     Desc: pt had cm years ago and was unable to breath after given cm.  stephanie, rt-r, Onset Date: [id number]   . Morphine Anxiety    "gets all crazy"  . Codeine Rash    Patient Measurements: Height: 5' (152.4 cm) Weight: 183 lb 1.6 oz (83.1 kg) IBW/kg (Calculated) : 45.5 Heparin Dosing Weight:    Vital Signs: Temp: 98.1 F (36.7 C) (06/15 0403) Temp Source: Oral (06/15 0403) BP: 217/52 (06/15 1018) Pulse Rate: 62 (06/15 0403)  Labs:  Recent Labs  09/27/16 1758 09/27/16 2238 09/28/16 0245 09/28/16 0843  HGB 8.4*  --   --  8.7*  HCT 27.0*  --   --  27.7*  PLT 305  --   --  296  HEPARINUNFRC  --   --  0.32 0.31  CREATININE 0.89  --   --  0.88  TROPONINI  --  <0.03 <0.03 <0.03    Estimated Creatinine Clearance: 53.6 mL/min (by C-G formula based on SCr of 0.88 mg/dL).   Assessment:  Anticoag: CP. Troponins negative. Hgb 8.7. Plts 296 stable. HL 0.32>0.31 remains in goal  Goal of Therapy:  Heparin level 0.3-0.7 units/ml Monitor platelets by anticoagulation protocol: Yes   Plan:  Increase Heparin drip to 1100 units / hr Daily heparin level, CBC   Russ Looper S. Alford Highland, PharmD, BCPS Clinical Staff Pharmacist Pager [phone]  Eilene Ghazi Stillinger 09/28/2016,10:41 AM

## 2016-09-28 NOTE — Consult Note (Signed)
Cardiology Consult    Patient ID: [name] MRN: [id number], DOB/AGE: Aug 15, 1940   Admit date: 09/27/2016 Date of Consult: 09/28/2016  Primary Physician: System, Provider Not In Reason for Consult: Chest Pain Primary Cardiologist: Previously Dr. Rex Kras - Now Dr. Claiborne Billings Requesting Provider: Dr. Grandville Silos   History of Present Illness    [name] is a 76 y.o. female with past medical history of HTN, HLD, Type 2 DM, GERD, and normal cors by cath in 2007 who is being seen today for the evaluation of chest pain and dyspnea at the request of Dr. Grandville Silos.   She presented to [facility] ED on 09/27/2016 for evaluation of chest pain and dyspnea. In talking with the patient today, she reports having worsening dyspnea at rest and with exertion for the past 4 weeks. She notes an associated dry cough and significant fatigue. She has developed a mild chest discomfort which is worse with coughing, no association with exertion. She denies any recent palpitations or presyncope. No orthopnea, PND, or lower extremity edema.   Her PCP checked recent labs and she was noted to be anemic. Per the patient's report she has a history of anemia and required iron transfusions last year. No recent melena or hematochezia. Reports her current dyspnea feels more severe when compared to prior episodes of anemia. She just had an echocardiogram on 09/25/2016 which showed a preserved EF of 66%, mild LVH, trace MR, mild TR, aortic sclerosis without stenosis and mild AI. Diastolic dysfunction was noted.   She was scheduled to see Cardiology as an outpatient, but with her worsening symptoms, she presented to the ED for further evaluation.   BP was at 192/65 on arrival. She has been continued on PTA HCTZ 25mg  daily, Clonidine 0.1mg  daily, and Losartan 100mg  daily. PTA Bisoprolol was held in the setting of bradycardia.   Initial labs show WBC of 6.0, Hgb 8.4, platelets 305. Creatinine 0.89. BNP 316. TSH 2.743.  Initial two troponin values have been negative. CXR with no active cardiopulmonary disease. EKG shows sinus bradycardia, HR 50, with no acute ST or T-wave changes.   She denies any family history of CAD. No prior tobacco use, alcohol use, or recreational drug use.   Past Medical History   Past Medical History:  Diagnosis Date  . Diabetes mellitus without complication ([place])   . GERD (gastroesophageal reflux disease)   . History of cardiac catheterization    a. normal cors by cath in 2007.  Marland Kitchen HLD (hyperlipidemia)   . Hypertension   . Hypothyroidism     Past Surgical History:  Procedure Laterality Date  . ABDOMINAL HYSTERECTOMY    . NECK SURGERY       Allergies  Allergies  Allergen Reactions  . Iohexol Shortness Of Breath     Desc: pt had cm years ago and was unable to breath after given cm.  stephanie, rt-r, Onset Date: [id number]   . Morphine Anxiety    "gets all crazy"  . Codeine Rash    Inpatient Medications    . aspirin EC  325 mg Oral Daily  . atorvastatin  20 mg Oral QHS  . cloNIDine  0.1 mg Oral BID  . furosemide  40 mg Intravenous Daily  . gabapentin  600 mg Oral Daily  . gabapentin  1,200 mg Oral QHS  . hydrochlorothiazide  25 mg Oral Daily  . insulin aspart  0-5 Units Subcutaneous QHS  . insulin aspart  0-9 Units Subcutaneous TID WC  .  levothyroxine  75 mcg Oral QAC breakfast  . loratadine  10 mg Oral Daily  . losartan  100 mg Oral Daily  . multivitamin with minerals  1 tablet Oral Daily  . pantoprazole  40 mg Oral Daily    Family History    Family History  Problem Relation Age of Onset  . Cerebral aneurysm Mother   . Hypertension Mother   . Pancreatic cancer Father   . Cancer Brother        Not know which type of cancer    Social History    Social History   Social History  . Marital status: Married    Spouse name: N/A  . Number of children: N/A  . Years of education: N/A   Occupational History  . Not on file.   Social History Main  Topics  . Smoking status: Never Smoker  . Smokeless tobacco: Never Used  . Alcohol use No  . Drug use: No  . Sexual activity: Not on file   Other Topics Concern  . Not on file   Social History Narrative  . No narrative on file     Review of Systems    General:  No chills, fever, night sweats or weight changes.  Cardiovascular:  No edema, orthopnea, palpitations, paroxysmal nocturnal dyspnea. Positive for chest pain and dyspnea on exertion.  Dermatological: No rash, lesions/masses Respiratory: Positive for a dry cough and dyspnea. Urologic: No hematuria, dysuria Abdominal:   No nausea, vomiting, diarrhea, bright red blood per rectum, melena, or hematemesis Neurologic:  No visual changes, wkns, changes in mental status. All other systems reviewed and are otherwise negative except as noted above.  Physical Exam    Blood pressure (!) 217/52, pulse 62, temperature 98.1 F (36.7 C), temperature source Oral, resp. rate 18, height 5' (1.524 m), weight 183 lb 1.6 oz (83.1 kg), SpO2 94 %.  General: Pleasant overweight Caucasian female appearing in NAD Psych: Normal affect. Neuro: Alert and oriented X 3. Moves all extremities spontaneously. HEENT: Normal  Neck: Supple without bruits or JVD. Lungs:  Resp regular and unlabored, CTA without wheezing or rales. Heart: RRR no s3, s4, or murmurs. Abdomen: Soft, non-tender, non-distended, BS + x 4.  Extremities: No clubbing or cyanosis. Trace edema. DP/PT/Radials 2+ and equal bilaterally.  Labs    Troponin [facility] of Care Test)  Recent Labs  09/27/16 1817  TROPIPOC 0.01    Recent Labs  09/27/16 2238 09/28/16 0245 09/28/16 0843  TROPONINI <0.03 <0.03 <0.03   Lab Results  Component Value Date   WBC 5.6 09/28/2016   HGB 8.7 (L) 09/28/2016   HCT 27.7 (L) 09/28/2016   MCV 100.0 09/28/2016   PLT 296 09/28/2016     Recent Labs Lab 09/28/16 0843  NA 142  K 3.5  CL 102  CO2 28  BUN 10  CREATININE 0.88  CALCIUM 8.9    GLUCOSE 128*   Lab Results  Component Value Date   CHOL 121 09/28/2016   HDL 26 (L) 09/28/2016   LDLCALC 67 09/28/2016   TRIG 142 09/28/2016   Radiology Studies    Dg Chest 2 View  Result Date: 09/27/2016 CLINICAL DATA:  Chest pain for 3 weeks. EXAM: CHEST  2 VIEW COMPARISON:  November 02, 2013 FINDINGS: The heart size and mediastinal contours are stable. The heart size is enlarged. Both lungs are clear. The visualized skeletal structures are unremarkable. IMPRESSION: No active cardiopulmonary disease. Electronically Signed   By: Mallie Darting.D.  On: 09/27/2016 18:51    EKG & Cardiac Imaging    EKG: Sinus bradycardia, HR 50, with no acute ST or T-wave changes - Personally Reviewed  Echocardiogram:  09/25/2016:   Preserved EF of 66%, mild LVH, trace MR, mild TR, aortic sclerosis without stenosis and mild AI. Diastolic dysfunction was noted.   Cardiac Catheterization: 2007 INDICATIONS:  [name] is a 76 year old female patient of Dr. Humphrey Rolls at  [facility] with a history of hypertension, hypothyroidism,  diabetes, hyperlipidemia, obesity who presented to the ER on Aug 26, 2005,  with complaint of substernal chest pain radiating to her left arm.   This was  relieved with aspirin.  She has had multiple ER admissions for chest pain  with negative stress test.  She is now for cardiac catheterization to rule  out significant CAD.   DESCRIPTION OF PROCEDURE:  After obtaining informed consent, the patient was  brought to the cardiac catheterization lab.  Right groin was shaved, prepped  and draped in usual sterile fashion.  Monitoring established.  Using  modified Seldinger technique, a 6-French arterial sheath was inserted in the  right femoral artery. A 6-French diagnostic catheter was used to perform  diagnostic angiography.   The left main is a large vessel with no significant disease.   The LAD is a large vessel that courses to the apex and gives rise to 1   diagonal branch.  The LAD is noted to have mild kinking segment in the mid  artery but no high-grade stenosis.   The first diagonal is a medium size vessel which bifurcates distally with no  significant disease.   Left circumflex is a large vessel coursing in the A-V groove and gives rise  to 2 obtuse marginal branches.  The A-V groove circumflex has no significant  disease.   The first OM is a large vessel which bifurcates distally and runs as a ramus  intermedius.  There is no significant disease in the first OM.   The second OM is a medium size vessel which bifurcates in the mid segment  with no significant disease.   The right coronary artery is a large vessel which is dominant and gives rise  to a PDA as well as a posterolateral branch.  There is no significant  disease in the RCA, PDA, or posterolateral branch.   Left ventriculogram was estimated at 60%.   Abdominal aortogram showed no significant aortic stenosis.   HEMODYNAMIC SYSTEM:  Left arterial pressure 176/90.  LV pressure 173/18, LV  EDP 25.   CONCLUSION:  1.  Non-critical coronary artery disease.  2.  Normal left ventricular systolic function.  3.  No evidence for aortic stenosis.  4.  Systemic hypertension.  Assessment & Plan    1. Dyspnea on Exertion - the patient reports having worsening dyspnea at rest and with exertion for the past 4 weeks. She notes an associated dry cough and significant fatigue. Reports a mild chest discomfort which is worse with coughing, no association with exertion. No orthopnea, PND, or lower extremity edema.  - recent echo on 09/25/2016 showed a preserved EF of 66%, mild LVH, trace MR, mild TR, aortic sclerosis without stenosis and mild AI. Diastolic dysfunction was noted.  -  Initial two troponin values have been negative. CXR with no active cardiopulmonary disease. EKG shows sinus bradycardia, HR 50, with no acute ST or T-wave changes.  - she has multiple issues which are  likely contributing to her dyspnea including CHF,  anemia, and accelerated HTN.   - will discuss further ischemic evaluation with Dr. Claiborne Billings. Would not recommend a cardiac catheterization at this time in the setting of her anemia. Could consider a NST to rule out ischemia as she does have multiple cardiac risk factors including HTN, HLD, and Type 2 DM.   ADDENDUM: most recent BP was 217/52, therefore will hold off on NST until tomorrow.   2. Atypical Chest Pain - reports chest discomfort in the setting of a dry cough.  - troponin values have been negative and EKG is without acute ischemic changes.  - overall, her pain seems atypical for a cardiac etiology. Plan for NST as above.   3. Acute on Chronic Diastolic CHF - echo on 7/42/5956 showed a preserved EF of 66%, mild LVH, trace MR, mild TR, aortic sclerosis without stenosis and mild AI. Diastolic dysfunction was noted.  - BNP mildly elevated to 316 on admission with CXR showing no active cardiopulmonary disease. Likely exacerbated by her accelerated HTN - received one dose of IV Lasix 40mg . She does not appear volume overloaded by physical examination.   4. Accelerated HTN - BP elevated to 192/65 on admission, still elevated to 181/61 on most recent check. - continue PTA Losartan 100mg  daily and HCTZ 25mg  daily. PTA Bisoprolol discontinued in the setting of bradycardia. She was only on Clonidine 0.1mg  ONCE DAILY as an outpatient which is likely causing rebound HTN. We usually try to avoid Clonidine due to the side-effect profile. Will stop Clonidine and start Amlodipine 5mg  daily.    5. Macrocytic Anemia - Hgb at 8.4 on admission. She reports a history of anemia and requiring iron transfusions in the past.  - she denies any recent melena or hematochezia. FOBT pending.  - per admitting team.   6. Sinus Bradycardia - HR in the low-50's upon admission. Now improved into the 60's.   - agree with holding PTA Bisoprolol.    Signed, Erma Heritage, PA-C 09/28/2016, 10:39 AM Pager: [phone]  Patient seen and examined. Agree with assessment and plan. [name] is a forme patient of Dr. Rex Kras.  She has a history of hypertension, hyperlipidemia, type 2 diabetes mellitus, obesity, and GERD.  She had undergone coronary angiography in 2007 for recurrent episodes of chest pain was not found to have any significant coronary obstructive disease.  Over the past month, she has noticed more episodes of exertional dyspnea and has had some episodes of mild shortness of breath at rest.  She denies any exertionally precipitated chest tightness.  She has noticed some mild chest discomfort with coughing.  She denies any episodes of palpitations, presyncope or syncope.  She had been found to be anemic and had undergone iron transfusions remotely.  On 09/25/2016, she underwent an echo Doppler study at [facility] which reportedly showed an EF of 65%.  There was mild left ventricular hypertrophy, aortic valve sclerosis without stenosis with mild AR, trace MR, mild TR, and she was found to have elevated filling pressures and diastolic dysfunction.  Recently, she had developed bradycardia and prior to admission bisoprolol had been reduced and ultimately held.  She was admitted with increasing episodes of shortness of breath and significant hypertension.  She most recently has been on losartan 100 mg daily, and HCTZ with recent discontinuance of bisoprolol.  Her blood pressure earlier today was accelerated at 217/52.  She had received her dose of clonidine and more recently this was in the 518A systolically.  Her exam is notable  for morbid obesity.  There is anicteric.  There was no JVD.  There were no carotid bruits.  There is no wheezing or rales on lung exam.  Rhythm was regular with a normal S1and S2.Marland Kitchen  There was 1/6 systolic murmur.  There was no S3 gallop.  I did not appreciate any diastolic murmur.  She had moderate central adiposity.  There is no  hepatosplenomegaly.  Pulses were adequate.  There was no significant edema.  Neurologic exam was grossly nonfocal.  Her ECG and independently reviewed by me reveals normal sinus rhythm at 63 bpm.  Is incomplete right bundle branch block.  Nonspecific T changes.  QTC is mildly increased at 470 Amy.  The clinical impression is that most likely [name]'s shortness of breath is contributed by her recent accelerated hypertension and diastolic dysfunction.  11 years ago.  Cardiac catheterization did not demonstrate any significant coronary obstructive disease.  With her blood pressure being significantly elevated earlier today.  I would defer nuclear stress testing today.  I'm adding amlodipine 5 mg total medical regimen.  With her diastolic dysfunction.  She may also benefit from aldosterone blockade with spironolactone but will not start this today.  Recommend discontinuance of clonidine.  She will continue with losartan.  If her pulse begins to increase reinitiation of lower dose beta blocker therapy may be helpful.  We will tentatively schedule her for a pharmacologic Lexiscan Myoview study tomorrow to make certain her dyspnea is not ischemia mediated.  We will follow patient with you.     Troy Sine, MD, [facility] 09/28/2016 11:57 AM

## 2016-09-29 ENCOUNTER — Inpatient Hospital Stay (HOSPITAL_COMMUNITY): Payer: Medicare HMO

## 2016-09-29 DIAGNOSIS — R0609 Other forms of dyspnea: Secondary | ICD-10-CM

## 2016-09-29 DIAGNOSIS — I208 Other forms of angina pectoris: Secondary | ICD-10-CM

## 2016-09-29 DIAGNOSIS — E785 Hyperlipidemia, unspecified: Secondary | ICD-10-CM

## 2016-09-29 DIAGNOSIS — E7800 Pure hypercholesterolemia, unspecified: Secondary | ICD-10-CM

## 2016-09-29 LAB — CBC
HEMATOCRIT: 28.8 % — AB (ref 36.0–46.0)
HEMOGLOBIN: 9 g/dL — AB (ref 12.0–15.0)
MCH: 31.7 pg (ref 26.0–34.0)
MCHC: 31.3 g/dL (ref 30.0–36.0)
MCV: 101.4 fL — AB (ref 78.0–100.0)
Platelets: 321 10*3/uL (ref 150–400)
RBC: 2.84 MIL/uL — AB (ref 3.87–5.11)
RDW: 19 % — ABNORMAL HIGH (ref 11.5–15.5)
WBC: 6.9 10*3/uL (ref 4.0–10.5)

## 2016-09-29 LAB — GLUCOSE, CAPILLARY
GLUCOSE-CAPILLARY: 194 mg/dL — AB (ref 65–99)
GLUCOSE-CAPILLARY: 287 mg/dL — AB (ref 65–99)
GLUCOSE-CAPILLARY: 191 mg/dL — AB (ref 65–99)

## 2016-09-29 LAB — HEMOGLOBIN A1C
HEMOGLOBIN A1C: 6.7 % — AB (ref 4.8–5.6)
Mean Plasma Glucose: 146 mg/dL

## 2016-09-29 LAB — BASIC METABOLIC PANEL
Anion gap: 13 (ref 5–15)
BUN: 16 mg/dL (ref 6–20)
CHLORIDE: 98 mmol/L — AB (ref 101–111)
CO2: 27 mmol/L (ref 22–32)
CREATININE: 1.14 mg/dL — AB (ref 0.44–1.00)
Calcium: 8.6 mg/dL — ABNORMAL LOW (ref 8.9–10.3)
GFR calc Af Amer: 54 mL/min — ABNORMAL LOW (ref >60)
GFR calc non Af Amer: 46 mL/min — ABNORMAL LOW (ref >60)
Glucose, Bld: 187 mg/dL — ABNORMAL HIGH (ref 65–99)
POTASSIUM: 3.6 mmol/L (ref 3.5–5.1)
SODIUM: 138 mmol/L (ref 135–145)

## 2016-09-29 LAB — NM MYOCAR MULTI W/SPECT W/WALL MOTION / EF
CHL CUP MPHR: 146 {beats}/min
CHL CUP RESTING HR STRESS: 56 {beats}/min
CSEPED: 5 min
CSEPEDS: 15 s
CSEPEW: 1 METS
CSEPHR: 60 %
Peak HR: 88 {beats}/min

## 2016-09-29 LAB — HEPARIN LEVEL (UNFRACTIONATED): Heparin Unfractionated: 0.31 IU/mL (ref 0.30–0.70)

## 2016-09-29 MED ORDER — CYANOCOBALAMIN 1000 MCG/ML IJ SOLN
1000.0000 ug | Freq: Every day | INTRAMUSCULAR | Status: DC
  Administered 2016-09-29: 1000 ug via INTRAMUSCULAR
  Filled 2016-09-29: qty 1

## 2016-09-29 MED ORDER — VITAMIN B-12 1000 MCG PO TABS: 1000.0000 ug | ORAL_TABLET | Freq: Every day | ORAL | 0 refills | Status: DC

## 2016-09-29 MED ORDER — REGADENOSON 0.4 MG/5ML IV SOLN
INTRAVENOUS | Status: AC
  Administered 2016-09-29: 0.4 mg via INTRAVENOUS
  Filled 2016-09-29: qty 5

## 2016-09-29 MED ORDER — FUROSEMIDE 40 MG PO TABS: 40.0000 mg | ORAL_TABLET | Freq: Every day | ORAL | Status: DC

## 2016-09-29 MED ORDER — TECHNETIUM TC 99M TETROFOSMIN IV KIT
10.0000 | PACK | Freq: Once | INTRAVENOUS | Status: AC | PRN
  Administered 2016-09-29: 10 via INTRAVENOUS

## 2016-09-29 MED ORDER — AMLODIPINE BESYLATE 5 MG PO TABS: 5.0000 mg | ORAL_TABLET | Freq: Every day | ORAL | 0 refills | Status: DC

## 2016-09-29 MED ORDER — TECHNETIUM TC 99M TETROFOSMIN IV KIT
30.0000 | PACK | Freq: Once | INTRAVENOUS | Status: AC | PRN
  Administered 2016-09-29: 30 via INTRAVENOUS

## 2016-09-29 MED ORDER — REGADENOSON 0.4 MG/5ML IV SOLN
0.4000 mg | Freq: Once | INTRAVENOUS | Status: AC
  Administered 2016-09-29: 0.4 mg via INTRAVENOUS
  Filled 2016-09-29: qty 5

## 2016-09-29 MED ORDER — HYDROCHLOROTHIAZIDE 25 MG PO TABS: 25.0000 mg | ORAL_TABLET | Freq: Every day | ORAL | 0 refills | Status: DC

## 2016-09-29 MED ORDER — AMLODIPINE BESYLATE 10 MG PO TABS: 10.0000 mg | ORAL_TABLET | Freq: Every day | ORAL | 0 refills | Status: DC

## 2016-09-29 MED ORDER — POTASSIUM CHLORIDE CRYS ER 20 MEQ PO TBCR
40.0000 meq | EXTENDED_RELEASE_TABLET | Freq: Once | ORAL | Status: AC
  Administered 2016-09-29: 40 meq via ORAL
  Filled 2016-09-29: qty 2

## 2016-09-29 NOTE — Progress Notes (Signed)
ANTICOAGULATION CONSULT NOTE - Follow Up Consult  Pharmacy Consult for Heparin Indication: chest pain/ACS  Allergies  Allergen Reactions  . Iohexol Shortness Of Breath     Desc: pt had cm years ago and was unable to breath after given cm.  stephanie, rt-r, Onset Date: [id number]   . Morphine Anxiety    "gets all crazy"  . Codeine Rash    Patient Measurements: Height: 5' (152.4 cm) Weight: 183 lb 1.6 oz (83.1 kg) IBW/kg (Calculated) : 45.5 Heparin Dosing Weight:    Vital Signs: Temp: 98.1 F (36.7 C) (06/16 0624) Temp Source: Oral (06/16 0624) BP: 173/43 (06/16 1057) Pulse Rate: 65 (06/16 1057)  Labs:  Recent Labs  09/27/16 1758 09/27/16 2238 09/28/16 0245 09/28/16 0843 09/29/16 0318  HGB 8.4*  --   --  8.7* 9.0*  HCT 27.0*  --   --  27.7* 28.8*  PLT 305  --   --  296 321  HEPARINUNFRC  --   --  0.32 0.31 0.31  CREATININE 0.89  --   --  0.88 1.14*  TROPONINI  --  <0.03 <0.03 <0.03  --     Estimated Creatinine Clearance: 41.4 mL/min (A) (by C-G formula based on SCr of 1.14 mg/dL (H)).   Assessment:  Anticoag: CP. Troponins negative. Hgb 8.7. Plts 296 stable. HL 0.32>0.31>0.31 remains in goal. CBC stable  Goal of Therapy:  Heparin level 0.3-0.7 units/ml Monitor platelets by anticoagulation protocol: Yes   Plan:  Continue Heparin drip at 1100 units / hr Daily heparin level, CBC   Maliq Pilley S. Alford Highland, PharmD, [facility] Clinical Staff Pharmacist Pager [phone]  Eilene Ghazi Stillinger 09/29/2016,11:50 AM

## 2016-09-29 NOTE — Progress Notes (Signed)
   Little Ishikawa presented for a nuclear stress test today.  No immediate complications.  Stress imaging is pending at this time.  Preliminary EKG findings may be listed in the chart, but the stress test result will not be finalized until perfusion imaging is complete.  1 day study, [place] to read.  Lenoard Aden 09/29/2016, 11:48 AM

## 2016-09-29 NOTE — Progress Notes (Signed)
Progress Note  Patient Name: [name] Date of Encounter: 09/29/2016  Primary Cardiologist: Dr Claiborne Billings  Patient Profile     76 y.o. female w/ hx  HTN, HLD, Type 2 DM, GERD, and normal cors by cath in 2007 who was admitted 06/14 with chest pain. BP very high on arrival, bisoprolol stopped due to bradycardia  Subjective   No more chest pain, no SOB. Wants to go home.  Inpatient Medications    Scheduled Meds: . amLODipine  5 mg Oral Daily  . aspirin EC  325 mg Oral Daily  . atorvastatin  20 mg Oral QHS  . cyanocobalamin  1,000 mcg Intramuscular Daily  . furosemide  40 mg Intravenous Daily  . gabapentin  600 mg Oral Daily  . gabapentin  1,200 mg Oral QHS  . hydrochlorothiazide  25 mg Oral Daily  . insulin aspart  0-5 Units Subcutaneous QHS  . insulin aspart  0-9 Units Subcutaneous TID WC  . levothyroxine  75 mcg Oral QAC breakfast  . loratadine  10 mg Oral Daily  . losartan  100 mg Oral Daily  . multivitamin with minerals  1 tablet Oral Daily  . pantoprazole  40 mg Oral Daily  . potassium chloride  40 mEq Oral Once   Continuous Infusions: . heparin 1,100 Units/hr (09/28/16 1905)   PRN Meds: acetaminophen, ALPRAZolam, hydrALAZINE, nitroGLYCERIN, ondansetron (ZOFRAN) IV, oxyCODONE-acetaminophen, zolpidem   Vital Signs    Vitals:   09/29/16 0624 09/29/16 0859 09/29/16 0923 09/29/16 0927  BP: (!) 168/46 (!) 176/69 (!) 164/66 (!) 175/65  Pulse: (!) 55 61 86 85  Resp: 18     Temp: 98.1 F (36.7 C)     TempSrc: Oral     SpO2: 94%     Weight:      Height:        Intake/Output Summary (Last 24 hours) at 09/29/16 0934 Last data filed at 09/29/16 0400  Gross per 24 hour  Intake              372 ml  Output             2000 ml  Net            -1628 ml   Filed Weights   09/27/16 1750 09/27/16 2219  Weight: 194 lb (88 kg) 183 lb 1.6 oz (83.1 kg)    Telemetry    SR, pt seen in nuc med - Personally Reviewed  ECG    SR, no acute changes - Personally  Reviewed  Physical Exam   General: Well developed, well nourished, female appearing in no acute distress. Head: Normocephalic, atraumatic.  Neck: Supple without bruits, JVD not elevated. Lungs:  Resp regular and unlabored, CTA. Heart: RRR, S1, S2, no S3, S4, soft murmur; no rub. Abdomen: Soft, non-tender, non-distended with normoactive bowel sounds. No hepatomegaly. No rebound/guarding. No obvious abdominal masses. Extremities: No clubbing, cyanosis, no edema. Distal pedal pulses are 2+ bilaterally. Neuro: Alert and oriented X 3. Moves all extremities spontaneously. Psych: Normal affect.  Labs    Hematology Recent Labs Lab 09/27/16 1758 09/27/16 2238 09/28/16 0843 09/29/16 0318  WBC 6.0  --  5.6 6.9  RBC 2.65* 2.72* 2.77* 2.84*  HGB 8.4*  --  8.7* 9.0*  HCT 27.0*  --  27.7* 28.8*  MCV 101.9*  --  100.0 101.4*  MCH 31.7  --  31.4 31.7  MCHC 31.1  --  31.4 31.3  RDW 19.2*  --  19.1*  19.0*  PLT 305  --  296 321    Chemistry Recent Labs Lab 09/27/16 1758 09/28/16 0843 09/29/16 0318  NA 142 142 138  K 3.8 3.5 3.6  CL 106 102 98*  CO2 27 28 27   GLUCOSE 122* 128* 187*  BUN 10 10 16   CREATININE 0.89 0.88 1.14*  CALCIUM 8.5* 8.9 8.6*  GFRNONAA >60 >60 46*  GFRAA >60 >60 54*  ANIONGAP 9 12 13      Cardiac Enzymes Recent Labs Lab 09/27/16 2238 09/28/16 0245 09/28/16 0843  TROPONINI <0.03 <0.03 <0.03    Recent Labs Lab 09/27/16 1817  TROPIPOC 0.01     BNP Recent Labs Lab 09/27/16 1758  BNP 316.2*     Radiology    Dg Chest 2 View  Result Date: 09/27/2016 CLINICAL DATA:  Chest pain for 3 weeks. EXAM: CHEST  2 VIEW COMPARISON:  November 02, 2013 FINDINGS: The heart size and mediastinal contours are stable. The heart size is enlarged. Both lungs are clear. The visualized skeletal structures are unremarkable. IMPRESSION: No active cardiopulmonary disease. Electronically Signed   By: Abelardo Diesel M.D.   On: 09/27/2016 18:51     Cardiac Studies   MV  ordered  Patient Profile     76 y.o. female w/ hx HTN, HLD, Type 2 DM, GERD, and normal cors by cath in 2007 who was admitted 06/14 with chest pain. BP very high on arrival, bisoprolol stopped due to bradycardia   Assessment & Plan     Principal Problem: 1.  Chest pain - ez neg MI - MV today to eval for ischemia - no further cardiac eval if negative - pt BP elevated in nuc med but she has not had am rx yet, follow.  Oherwise, per IM Active Problems:   Hypertension   Diabetes mellitus without complication ([place])   HLD (hyperlipidemia)   GERD (gastroesophageal reflux disease)   Hypothyroidism   Hypertensive urgency   Macrocytic anemia   Acute on chronic diastolic CHF (congestive heart failure) ([place])    Signed, Rosaria Ferries , PA-C 9:34 AM 09/29/2016 Pager: [phone]  Agree with note by Rosaria Ferries PA-C  Atypical chest pain with known normal coronary arteries at cath in 2007. Hypertension poorly controlled but better controlled since medication adjustment this admission. Enzymes negative. Patient is having from a neurologic Myoview stress test this morning. If negative no further workup will be recommended.   Lorretta Harp, M.D., [place], [facility], Laverta [place] Gillespie [address] [place], [place]  [address]  [phone] 09/29/2016 10:41 AM

## 2016-09-29 NOTE — Progress Notes (Signed)
PROGRESS NOTE    ZONDRA LAWLOR  MGQ:[id number] DOB: 1940/12/02 DOA: 09/27/2016 PCP: System, Provider Not In    Brief Narrative:   [name] is a 76 y.o. female with history of hypertension, hyperlipidemia, diabetes, hypothyroidism presented to the ED with intermittent chest pain 4 weeks pressure-like sensation radiating to the left upper extremity and jaw. Concern for unstable angina. Patient also noted to have hypertensive urgency. Patient admitted for ACS workup. Patient seen in consultation per cardiology. Patient underwent Myoview stress study 09/29/2016.    Assessment & Plan:   Principal Problem:   Chest pain Active Problems:   Hypertension   Diabetes mellitus without complication (H[facility])   HLD (hyperlipidemia)   GERD (gastroesophageal reflux disease)   Hypothyroidism   Hypertensive urgency   Macrocytic anemia   Acute on chronic diastolic CHF (congestive heart failure) ([place])  #1 chest pain Questionable etiology. Patient with multiple cardiac risk factors presented with chest pain or shortness of breath. Cardiac enzymes negative 3. Fasting lipid panel with LDL of 67. 2-D echo pending. Patient status post Myoview stress study with results pending. Clinical improvement. Continue aspirin, Lipitor, Lasix, Cozaar. Cardiology following and appreciate input and recommendations.   #2 hypertensive urgency Patient noted to have systolic blood pressures in the 200s the morning of 09/28/2016. Patient's blood pressure improved on antihypertensive medication changes per cardiology. Continue Norvasc, Lasix, HCTZ, Cozaar. If further blood pressure control is needed Norvasc may be up titrated to 10 mg daily. Patient's clonidine has been discontinued per cardiology recommendations. Cardiology following.  #3 acute on chronic diastolic heart failure Patient noted to be volume overloaded on admission with some lower extremity edema. BNP elevated at 316. Patient on IV Lasix. Cardiac  enzymes negative 3. Patient with a urine output of 2.9 L over the past 24 hours and is - 5.3 L during this hospitalization. 2-D echo pending. Patient status post Myoview stress test today 09/29/2016. Will change IV Lasix to oral Lasix. Cardiology following.   #4 hypothyroidism Continue Synthroid.  #5  diabetes mellitus type 2  cbg 135 - 287. Continue SSI  #6 GERD PPI  #7 hyperlipidemia Continue Lipitor.    DVT prophylaxis: Heparin Code Status: Full Family Communication: Updated patient and family at bedside. Disposition Plan: Home once cardiac workup is completed and per cardiology.   Consultants:   Cardiology: Dr. Claiborne Billings 09/28/2016  Procedures:   Chest x-ray 09/27/2016  Myoview stress test 09/29/2016 pending  Antimicrobials:   None   Subjective: Patient laying in bed after Myoview stress test. No chest pain. No shortness of breath. Feeling better than on admission.   Objective: Vitals:   09/29/16 0859 09/29/16 0923 09/29/16 0927 09/29/16 1057  BP: (!) 176/69 (!) 164/66 (!) 175/65 (!) 173/43  Pulse: 61 86 85 65  Resp:      Temp:      TempSrc:      SpO2:    97%  Weight:      Height:        Intake/Output Summary (Last 24 hours) at 09/29/16 1327 Last data filed at 09/29/16 0800  Gross per 24 hour  Intake              372 ml  Output             1300 ml  Net             -928 ml   Filed Weights   09/27/16 1750 09/27/16 2219  Weight: 88 kg (194 lb) 83.1  kg (183 lb 1.6 oz)    Examination:  General exam: Appears calm and comfortable  Respiratory system: Clear to auscultation. Respiratory effort normal. Cardiovascular system: S1 & S2 heard, RRR. No JVD, murmurs, rubs, gallops or clicks. No pedal edema. Gastrointestinal system: Abdomen is obese, mildly distended, soft and nontender. No organomegaly or masses felt. Normal bowel sounds heard. Central nervous system: Alert and oriented. No focal neurological deficits. Extremities: Symmetric 5 x 5  power. Skin: No rashes, lesions or ulcers Psychiatry: Judgement and insight appear normal. Mood & affect appropriate.     Data Reviewed: I have personally reviewed following labs and imaging studies  CBC:  Recent Labs Lab 09/27/16 1758 09/28/16 0843 09/29/16 0318  WBC 6.0 5.6 6.9  HGB 8.4* 8.7* 9.0*  HCT 27.0* 27.7* 28.8*  MCV 101.9* 100.0 101.4*  PLT 305 296 656   Basic Metabolic Panel:  Recent Labs Lab 09/27/16 1758 09/28/16 0843 09/29/16 0318  NA 142 142 138  K 3.8 3.5 3.6  CL 106 102 98*  CO2 27 28 27   GLUCOSE 122* 128* 187*  BUN 10 10 16   CREATININE 0.89 0.88 1.14*  CALCIUM 8.5* 8.9 8.6*   GFR: Estimated Creatinine Clearance: 41.4 mL/min (A) (by C-G formula based on SCr of 1.14 mg/dL (H)). Liver Function Tests: No results for input(s): AST, ALT, ALKPHOS, BILITOT, PROT, ALBUMIN in the last 168 hours. No results for input(s): LIPASE, AMYLASE in the last 168 hours. No results for input(s): AMMONIA in the last 168 hours. Coagulation Profile: No results for input(s): INR, PROTIME in the last 168 hours. Cardiac Enzymes:  Recent Labs Lab 09/27/16 2238 09/28/16 0245 09/28/16 0843  TROPONINI <0.03 <0.03 <0.03   BNP (last 3 results) No results for input(s): PROBNP in the last 8760 hours. HbA1C:  Recent Labs  09/28/16 0245  HGBA1C 6.7*   CBG:  Recent Labs Lab 09/28/16 1219 09/28/16 1651 09/28/16 2114 09/29/16 0625 09/29/16 1114  GLUCAP 162* 219* 135* 194* 287*   Lipid Profile:  Recent Labs  09/28/16 0245  CHOL 121  HDL 26*  LDLCALC 67  TRIG 142  CHOLHDL 4.7   Thyroid Function Tests:  Recent Labs  09/28/16 0245  TSH 2.743   Anemia Panel:  Recent Labs  09/27/16 2238  VITAMINB12 270  FOLATE 43.8  FERRITIN 139  TIBC 332  IRON 49  RETICCTPCT 3.1   Sepsis Labs: No results for input(s): PROCALCITON, LATICACIDVEN in the last 168 hours.  No results found for this or any previous visit (from the past 240 hour(s)).        Radiology Studies: Dg Chest 2 View  Result Date: 09/27/2016 CLINICAL DATA:  Chest pain for 3 weeks. EXAM: CHEST  2 VIEW COMPARISON:  November 02, 2013 FINDINGS: The heart size and mediastinal contours are stable. The heart size is enlarged. Both lungs are clear. The visualized skeletal structures are unremarkable. IMPRESSION: No active cardiopulmonary disease. Electronically Signed   By: Abelardo Diesel M.D.   On: 09/27/2016 18:51        Scheduled Meds: . amLODipine  5 mg Oral Daily  . aspirin EC  325 mg Oral Daily  . atorvastatin  20 mg Oral QHS  . cyanocobalamin  1,000 mcg Intramuscular Daily  . furosemide  40 mg Intravenous Daily  . gabapentin  600 mg Oral Daily  . gabapentin  1,200 mg Oral QHS  . hydrochlorothiazide  25 mg Oral Daily  . insulin aspart  0-5 Units Subcutaneous QHS  . insulin aspart  0-9 Units Subcutaneous TID WC  . levothyroxine  75 mcg Oral QAC breakfast  . loratadine  10 mg Oral Daily  . losartan  100 mg Oral Daily  . multivitamin with minerals  1 tablet Oral Daily  . pantoprazole  40 mg Oral Daily   Continuous Infusions: . heparin 1,100 Units/hr (09/28/16 1905)     LOS: 1 day    Time spent: [place], MD [facility] Pager [phone]  If 7PM-7AM, please contact night-coverage [email or web address] Password [email or web address] 09/29/2016, 1:27 PM

## 2016-09-29 NOTE — Discharge Summary (Signed)
Physician Discharge Summary  [name] XBM:[id number] DOB: May 06, 1940 DOA: 09/27/2016  PCP: System, Provider Not In  Admit date: 09/27/2016 Discharge date: 09/29/2016  Time spent: 60 minutes  Recommendations for Outpatient Follow-up:  1. Follow-up with Dr. Shelva Majestic, M.D. cardiology in 3 weeks. 2. Follow-up with PCP in 2 weeks. On follow-up if patient continues to have persistent chest pain may benefit from a referral to gastroenterology for further evaluation as patient's Myoview during the hospitalization was negative. Patient's blood pressure also needs to be reassessed on follow-up. Patient will need a basic metabolic profile done to follow-up on electrolytes and renal function.   Discharge Diagnoses:  Principal Problem:   Chest pain Active Problems:   Hypertension   Diabetes mellitus without complication (H[facility])   HLD (hyperlipidemia)   GERD (gastroesophageal reflux disease)   Hypothyroidism   Hypertensive urgency   Macrocytic anemia   Acute on chronic diastolic CHF (congestive heart failure) ([facility])   Dyspnea on exertion   Discharge Condition: Stable and improved  Diet recommendation: Heart healthy  Filed Weights   09/27/16 1750 09/27/16 2219  Weight: 88 kg (194 lb) 83.1 kg (183 lb 1.6 oz)    History of present illness:  Per Dr [name] is a 76 y.o. female with medical history significant of hypertension, hyperlipidemia, diabetes mellitus, GERD, hypothyroidism, slightly, who presented with chest pain.  Patient stated that she had been having intermittent chest pain for almost 4 weeks. It was located in the substernal area and left side of chest, pressure-like, radiating to jaw and the left arm. On day of admission her chest pain had worsened. She had 2 episodes of chest pain, each time lasted for about 20-30 minutes, and resolved spontaneously. It is associated with shortness breath on exertion. She had dry cough, but no fever or chills. Patient did  not have tenderness in the calf areas. Pt was seen by her PCP and had 2d echo on 09/24/16, which showed EF 66% with grade 2 diastolic dysfunction. She also has mild bilateral leg edema. Patient denied nausea, vomiting, diarrhea, abdominal pain, symptoms of UTI or unilateral weakness. No hematuria or hematochezia. Pt stated that she was taking Bisoprolol/HCTZ pill for HTN. Due to bradycardia, she stopped taking it today as instructed by her PCP.  ED Course: pt was found to have elevated blood pressure 192/65, negative troponin, WBC 6.0, hemoglobin 8.4 which was 12.48/9/12, creatinine normal, temperature normal, bradycardia with heart rates at 40-50s, negative chest x-ray for acute abnormalities. Patient was placed on telemetry bed for observation.   Hospital Course:  #1 chest pain Questionable etiology. Patient with multiple cardiac risk factors presented with chest pain or shortness of breath. Cardiac enzymes negative 3. Fasting lipid panel with LDL of 67. 2-D echo ordered. Cardiology consulted and due to multiple risk factors Myoview stress test was recommended. pending. Patient status post Myoview stress study 09/29/2016 that showed a normal myocardial perfusion, low risk study, overall left ventricular systolic function was normal, EF 65%. Patient did not have any further chest pain during the hospitalization and discharged home in stable and improved condition. During the hospitalization patient was maintained on aspirin, Lipitor, Lasix, Cozaar. Patient will follow-up with cardiology in the outpatient setting.   #2 hypertensive urgency Patient noted to have systolic blood pressures in the 200s the morning of 09/28/2016. Patient's blood pressure improved on antihypertensive medication changes per cardiology. Patient's bisoprolol was discontinued patient maintained on Norvasc, HCTZ and Cozaar. Patient was also on IV Lasix for  volume overload which improved. Patient's blood pressure improved and  patient be discharged in stable condition. Outpatient follow-up.   #3 acute on chronic diastolic heart failure Patient noted to be volume overloaded on admission with some lower extremity edema. BNP elevated at 316. Patient on IV Lasix. Cardiac enzymes negative 3. Patient was negative 4.84 L during this hospitalization. Patient improved clinically. Patient underwent a Myoview stress test which was low risk. Patient was discharged home in stable condition will follow-up with cardiology in the outpatient setting.   #4 hypothyroidism Patient was maintained on home regimen of Synthroid.  #5  diabetes mellitus type 2  Patient's oral hypoglycemic agents were held during the hospitalization and patient placed on sliding scale insulin.   #6 GERD Patient was maintained on a PPI.  #7 hyperlipidemia Continued on home regimen of Lipitor.   Procedures:  Chest x-ray 09/27/2016  Myoview stress test 09/29/2016   Consultations:  Cardiology: Dr. Claiborne Billings 09/28/2016  Discharge Exam: Vitals:   09/29/16 1346 09/29/16 1527  BP: (!) 155/48 (!) 149/54  Pulse: (!) 58 62  Resp:    Temp: 97.9 F (36.6 C)     General: NAD Cardiovascular: RRR Respiratory: CTAB  Discharge Instructions   Discharge Instructions    Diet - low sodium heart healthy    Complete by:  As directed    Increase activity slowly    Complete by:  As directed      Current Discharge Medication List    START taking these medications   Details  amLODipine (NORVASC) 10 MG tablet Take 1 tablet (10 mg total) by mouth daily. Qty: 31 tablet, Refills: 0    hydrochlorothiazide (HYDRODIURIL) 25 MG tablet Take 1 tablet (25 mg total) by mouth daily. Qty: 30 tablet, Refills: 0    vitamin B-12 (CYANOCOBALAMIN) 1000 MCG tablet Take 1 tablet (1,000 mcg total) by mouth daily. Qty: 30 tablet, Refills: 0      CONTINUE these medications which have NOT CHANGED   Details  ALPRAZolam (XANAX) 1 MG tablet Take 1 mg by mouth as  needed for anxiety.    aspirin EC 81 MG tablet Take 81 mg by mouth daily.    atorvastatin (LIPITOR) 20 MG tablet Take 20 mg by mouth at bedtime.     gabapentin (NEURONTIN) 600 MG tablet Take 600-1,200 mg by mouth 2 (two) times daily. Take 600 mg in the morning and 1200 mg at night    glimepiride (AMARYL) 1 MG tablet Take 4 mg by mouth daily.    levothyroxine (SYNTHROID, LEVOTHROID) 75 MCG tablet Take 75 mcg by mouth daily.    loratadine (CLARITIN) 10 MG tablet Take 10 mg by mouth daily.    losartan (COZAAR) 100 MG tablet Take 100 mg by mouth daily.    metFORMIN (GLUCOPHAGE-XR) 750 MG 24 hr tablet Take 1,500 mg by mouth daily.    Multiple Vitamin (MULTI-VITAMINS) TABS Take 1 tablet by mouth daily.    pantoprazole (PROTONIX) 40 MG tablet Take 40 mg by mouth daily.      STOP taking these medications     cloNIDine (CATAPRES) 0.1 MG tablet        Allergies  Allergen Reactions  . Iohexol Shortness Of Breath     Desc: pt had cm years ago and was unable to breath after given cm.  stephanie, rt-r, Onset Date: [id number]   . Morphine Anxiety    "gets all crazy"  . Codeine Rash   Follow-up Information    Troy Sine, MD.  Schedule an appointment as soon as possible for a visit in 3 week(s).   Specialty:  Cardiology Contact information: [address] [place] [address] [phone]        PCP. Schedule an appointment as soon as possible for a visit in 2 week(s).            The results of significant diagnostics from this hospitalization (including imaging, microbiology, ancillary and laboratory) are listed below for reference.    Significant Diagnostic Studies: Dg Chest 2 View  Result Date: 09/27/2016 CLINICAL DATA:  Chest pain for 3 weeks. EXAM: CHEST  2 VIEW COMPARISON:  November 02, 2013 FINDINGS: The heart size and mediastinal contours are stable. The heart size is enlarged. Both lungs are clear. The visualized skeletal structures are unremarkable.  IMPRESSION: No active cardiopulmonary disease. Electronically Signed   By: Abelardo Diesel M.D.   On: 09/27/2016 18:51   Nm Myocar Multi W/spect W/wall Motion / Ef  Result Date: 09/29/2016  There was no ST segment deviation noted during stress.  The study is normal. Perfusion defects present as described likely represent artifact. Cannot completely excluded small septal infarct. There is no evidence of any current ishcemia  This is a low risk study. [place] elevated TID of 1.28 of unclear significance.  The left ventricular ejection fraction is hyperdynamic (>65%).     Microbiology: No results found for this or any previous visit (from the past 240 hour(s)).   Labs: Basic Metabolic Panel:  Recent Labs Lab 09/27/16 1758 09/28/16 0843 09/29/16 0318  NA 142 142 138  K 3.8 3.5 3.6  CL 106 102 98*  CO2 27 28 27   GLUCOSE 122* 128* 187*  BUN 10 10 16   CREATININE 0.89 0.88 1.14*  CALCIUM 8.5* 8.9 8.6*   Liver Function Tests: No results for input(s): AST, ALT, ALKPHOS, BILITOT, PROT, ALBUMIN in the last 168 hours. No results for input(s): LIPASE, AMYLASE in the last 168 hours. No results for input(s): AMMONIA in the last 168 hours. CBC:  Recent Labs Lab 09/27/16 1758 09/28/16 0843 09/29/16 0318  WBC 6.0 5.6 6.9  HGB 8.4* 8.7* 9.0*  HCT 27.0* 27.7* 28.8*  MCV 101.9* 100.0 101.4*  PLT 305 296 321   Cardiac Enzymes:  Recent Labs Lab 09/27/16 2238 09/28/16 0245 09/28/16 0843  TROPONINI <0.03 <0.03 <0.03   BNP: BNP (last 3 results)  Recent Labs  09/27/16 1758  BNP 316.2*    ProBNP (last 3 results) No results for input(s): PROBNP in the last 8760 hours.  CBG:  Recent Labs Lab 09/28/16 1651 09/28/16 2114 09/29/16 0625 09/29/16 1114 09/29/16 1642  GLUCAP 219* 135* 194* 287* 191*       Signed:  THOMPSON,DANIEL MD.  [facility] 09/29/2016, 6:33 PM

## 2016-10-12 DIAGNOSIS — D649 Anemia, unspecified: Secondary | ICD-10-CM | POA: Diagnosis not present

## 2016-10-12 DIAGNOSIS — I10 Essential (primary) hypertension: Secondary | ICD-10-CM | POA: Diagnosis not present

## 2016-10-12 DIAGNOSIS — E538 Deficiency of other specified B group vitamins: Secondary | ICD-10-CM | POA: Diagnosis not present

## 2016-10-12 DIAGNOSIS — Z6836 Body mass index (BMI) 36.0-36.9, adult: Secondary | ICD-10-CM | POA: Diagnosis not present

## 2016-10-12 DIAGNOSIS — Z79899 Other long term (current) drug therapy: Secondary | ICD-10-CM | POA: Diagnosis not present

## 2016-10-22 ENCOUNTER — Ambulatory Visit: Payer: Medicare HMO | Admitting: Cardiovascular Disease

## 2016-10-22 ENCOUNTER — Ambulatory Visit (INDEPENDENT_AMBULATORY_CARE_PROVIDER_SITE_OTHER): Payer: Medicare HMO | Admitting: Cardiovascular Disease

## 2016-10-22 VITALS — BP 151/81 | HR 75 | Ht 60.0 in | Wt 194.8 lb

## 2016-10-22 DIAGNOSIS — D539 Nutritional anemia, unspecified: Secondary | ICD-10-CM | POA: Diagnosis not present

## 2016-10-22 DIAGNOSIS — I10 Essential (primary) hypertension: Secondary | ICD-10-CM | POA: Diagnosis not present

## 2016-10-22 DIAGNOSIS — R7989 Other specified abnormal findings of blood chemistry: Secondary | ICD-10-CM | POA: Diagnosis not present

## 2016-10-22 DIAGNOSIS — E785 Hyperlipidemia, unspecified: Secondary | ICD-10-CM

## 2016-10-22 DIAGNOSIS — E119 Type 2 diabetes mellitus without complications: Secondary | ICD-10-CM | POA: Diagnosis not present

## 2016-10-22 DIAGNOSIS — E039 Hypothyroidism, unspecified: Secondary | ICD-10-CM | POA: Diagnosis not present

## 2016-10-22 DIAGNOSIS — R945 Abnormal results of liver function studies: Secondary | ICD-10-CM

## 2016-10-22 MED ORDER — METOPROLOL SUCCINATE ER 25 MG PO TB24: 12.5000 mg | ORAL_TABLET | Freq: Every day | ORAL | 3 refills | Status: DC

## 2016-10-22 NOTE — Patient Instructions (Signed)
Your physician has recommended you make the following change in your medication:   1.) start new metoprolol succ er 25 mg prescription. As directed on the bottle. This has been sent to your pharmacy.  Your physician recommends that you schedule a follow-up appointment in: 3 months with your primary care doctor and 6 months with Dr Claiborne Billings.

## 2016-10-23 NOTE — Progress Notes (Signed)
Cardiology Office Note    Date:  10/24/2016   ID:  [name], DOB [birth year], MRN [id number]  PCP:  Mateo Flow, MD  Cardiologist:  Shelva Majestic, MD   Chief Complaint  Patient presents with  . Follow-up    pt denied chest pain and SOB    History of Present Illness:  [name] is a 76 y.o. female who presents for hospital follow-up evaluation.  [name] is a former patient of Dr. Rex Kras.  She has a history of hypertension, hyperlipidemia, type IIb diabetes mellitus, obesity, and GERD.  In 2007.  She had undergone cardiac catheterization which did not reveal significant coronary obstructive disease.  She was admitted to the hospital in June 2018 after experiencing more episodes of exertional dyspnea and some episodes of mild shortness of breath.  An echo Doppler study at [facility] showed an EF of 65% with LVH, aortic valve sclerosis without stenosis, mild AR, trace MR, mild TR, but she was found to have diastolic dysfunction and elevated filling pressures.  She did recently developed accelerated hypertension up to 217/52.  It was felt that some of her shortness of breath was contributed by her accelerated hypertension in the setting of diastolic dysfunction.  During her hospitalization she was started on amlodipine and consideration for spironolactone was discussed.  She apparently was discharged by the hospitalists on 09/29/2016 on amlodipine 10 mg, HCTZ 25 mg, losartan 100 mg, for her hypertension.  She is diabetic on metformin and [place] Pro bromide.  She has a history of hypothyroidism and is on Synthroid.  She has GERD and is on Protonix.  She presents for follow-up evaluation.  Past Medical History:  Diagnosis Date  . Diabetes mellitus without complication ([place])   . GERD (gastroesophageal reflux disease)   . History of cardiac catheterization    a. normal cors by cath in 2007.  Marland Kitchen HLD (hyperlipidemia)   . Hypertension   . Hypothyroidism     Past  Surgical History:  Procedure Laterality Date  . ABDOMINAL HYSTERECTOMY    . NECK SURGERY      Current Medications: Outpatient Medications Prior to Visit  Medication Sig Dispense Refill  . ALPRAZolam (XANAX) 1 MG tablet Take 1 mg by mouth as needed for anxiety.    Marland Kitchen amLODipine (NORVASC) 10 MG tablet Take 1 tablet (10 mg total) by mouth daily. 31 tablet 0  . aspirin EC 81 MG tablet Take 81 mg by mouth daily.    Marland Kitchen atorvastatin (LIPITOR) 20 MG tablet Take 20 mg by mouth at bedtime.     . gabapentin (NEURONTIN) 600 MG tablet Take 600-1,200 mg by mouth 2 (two) times daily. Take 600 mg in the morning and 1200 mg at night    . glimepiride (AMARYL) 1 MG tablet Take 4 mg by mouth daily.    . hydrochlorothiazide (HYDRODIURIL) 25 MG tablet Take 1 tablet (25 mg total) by mouth daily. 30 tablet 0  . levothyroxine (SYNTHROID, LEVOTHROID) 75 MCG tablet Take 75 mcg by mouth daily.    Marland Kitchen loratadine (CLARITIN) 10 MG tablet Take 10 mg by mouth daily.    Marland Kitchen losartan (COZAAR) 100 MG tablet Take 100 mg by mouth daily.    . metFORMIN (GLUCOPHAGE-XR) 750 MG 24 hr tablet Take 1,500 mg by mouth daily.    . Multiple Vitamin (MULTI-VITAMINS) TABS Take 1 tablet by mouth daily.    . pantoprazole (PROTONIX) 40 MG tablet Take 40 mg by mouth daily.    Marland Kitchen  vitamin B-12 (CYANOCOBALAMIN) 1000 MCG tablet Take 1 tablet (1,000 mcg total) by mouth daily. 30 tablet 0   No facility-administered medications prior to visit.      Allergies:   Iohexol; Morphine; and Codeine   Social History   Social History  . Marital status: Married    Spouse name: N/A  . Number of children: N/A  . Years of education: N/A   Social History Main Topics  . Smoking status: Never Smoker  . Smokeless tobacco: Never Used  . Alcohol use No  . Drug use: No  . Sexual activity: Not on file   Other Topics Concern  . Not on file   Social History Narrative  . No narrative on file     Family History:  The patient's family history includes Cancer  in her brother; Cerebral aneurysm in her mother; Hypertension in her mother; Pancreatic cancer in her father.   ROS General: Negative; No fevers, chills, or night sweats; Obese HEENT: Negative; No changes in vision or hearing, sinus congestion, difficulty swallowing Pulmonary: Negative; No cough, wheezing, , hemoptysis Cardiovascular: No chest pain, presyncope, syncope, palpitations GI: Negative; No nausea, vomiting, diarrhea, or abdominal pain GU: Negative; No dysuria, hematuria, or difficulty voiding Musculoskeletal: Negative; no myalgias, joint pain, or weakness Hematologic/Oncology: Negative; no easy bruising, bleeding Endocrine: Negative; no heat/cold intolerance; no diabetes Neuro: Negative; no changes in balance, headaches Skin: Negative; No rashes or skin lesions Psychiatric: Negative; No behavioral problems, depression Sleep: Mild snoring, no daytime sleepiness, hypersomnolence, bruxism, restless legs, hypnogognic hallucinations, no cataplexy Other comprehensive 14 point system review is negative.   PHYSICAL EXAM:   VS:  BP (!) 151/81   Pulse 75   Ht 5' (1.524 m)   Wt 194 lb 12.8 oz (88.4 kg)   BMI 38.04 kg/m     Wt Readings from Last 3 Encounters:  10/22/16 194 lb 12.8 oz (88.4 kg)  09/27/16 183 lb 1.6 oz (83.1 kg)    General: Alert, oriented, no distress.  Skin: normal turgor, no rashes, warm and dry HEENT: Normocephalic, atraumatic. Pupils equal round and reactive to light; sclera anicteric; extraocular muscles intact; Nose without nasal septal hypertrophy Mouth/Parynx benign; Mallinpatti scale 3 Neck: No JVD, no carotid bruits; normal carotid upstroke Lungs: clear to ausculatation and percussion; no wheezing or rales Chest wall: without tenderness to palpitation Heart: PMI not displaced, RRR, s1 s2 normal, 1/6 systolic murmur, no diastolic murmur, no rubs, gallops, thrills, or heaves Abdomen: soft, nontender; no hepatosplenomehaly, BS+; abdominal aorta nontender  and not dilated by palpation. Back: no CVA tenderness Pulses 2+ Musculoskeletal: full range of motion, normal strength, no joint deformities Extremities: no clubbing cyanosis or edema, Homan's sign negative  Neurologic: grossly nonfocal; Cranial nerves grossly wnl Psychologic: Normal mood and affect   Studies/Labs Reviewed:   EKG:  EKG is ordered today.  ECG (independently read by me): Normal sinus rhythm at 75 bpm.  Incomplete right bundle-branch block with repolarization changes.  Small Q wave in lead 3.  No otherwise  significant ST changes  Recent Labs: BMP Latest Ref Rng & Units 09/29/2016 09/28/2016 09/27/2016  Glucose 65 - 99 mg/dL 187(H) 128(H) 122(H)  BUN 6 - 20 mg/dL '16 10 10  ' Creatinine 0.44 - 1.00 mg/dL 1.14(H) 0.88 0.89  Sodium 135 - 145 mmol/L 138 142 142  Potassium 3.5 - 5.1 mmol/L 3.6 3.5 3.8  Chloride 101 - 111 mmol/L 98(L) 102 106  CO2 22 - 32 mmol/L '27 28 27  ' Calcium 8.9 -  10.3 mg/dL 8.6(L) 8.9 8.5(L)     Hepatic Function 10/12/2006 10/10/2006  Total Protein 6.4 7.5  Albumin 3.1(L) 3.7  AST 52(H) 94(H)  ALT 77(H) 113(H)  Alk Phosphatase 70 88  Total Bilirubin 0.8 0.7    CBC Latest Ref Rng & Units 09/29/2016 09/28/2016 09/27/2016  WBC 4.0 - 10.5 K/uL 6.9 5.6 6.0  Hemoglobin 12.0 - 15.0 g/dL 9.0(L) 8.7(L) 8.4(L)  Hematocrit 36.0 - 46.0 % 28.8(L) 27.7(L) 27.0(L)  Platelets 150 - 400 K/uL 321 296 305   Lab Results  Component Value Date   MCV 101.4 (H) 09/29/2016   MCV 100.0 09/28/2016   MCV 101.9 (H) 09/27/2016   Lab Results  Component Value Date   TSH 2.743 09/28/2016   Lab Results  Component Value Date   HGBA1C 6.7 (H) 09/28/2016     BNP    Component Value Date/Time   BNP 316.2 (H) 09/27/2016 1758    ProBNP    Component Value Date/Time   PROBNP <30.0 10/10/2006 2125     Lipid Panel     Component Value Date/Time   CHOL 121 09/28/2016 0245   TRIG 142 09/28/2016 0245   HDL 26 (L) 09/28/2016 0245   CHOLHDL 4.7 09/28/2016 0245   VLDL 28  09/28/2016 0245   LDLCALC 67 09/28/2016 0245     RADIOLOGY: Dg Chest 2 View  Result Date: 09/27/2016 CLINICAL DATA:  Chest pain for 3 weeks. EXAM: CHEST  2 VIEW COMPARISON:  November 02, 2013 FINDINGS: The heart size and mediastinal contours are stable. The heart size is enlarged. Both lungs are clear. The visualized skeletal structures are unremarkable. IMPRESSION: No active cardiopulmonary disease. Electronically Signed   By: Abelardo Diesel M.D.   On: 09/27/2016 18:51   Nm Myocar Multi W/spect W/wall Motion / Ef  Result Date: 09/29/2016  There was no ST segment deviation noted during stress.  The study is normal. Perfusion defects present as described likely represent artifact. Cannot completely excluded small septal infarct. There is no evidence of any current ishcemia  This is a low risk study. Borderlie elevated TID of 1.28 of unclear significance.  The left ventricular ejection fraction is hyperdynamic (>65%).      Additional studies/ records that were reviewed today include:  I reviewed her recent hospitalization records.    ASSESSMENT:    1. Essential hypertension   2. Diabetes mellitus without complication ([place])   3. Hyperlipidemia, unspecified hyperlipidemia type   4. Hypothyroidism, unspecified type   5. Morbid obesity ([place])   6. LFT elevation   7. Macrocytic anemia      PLAN:  [name] is a 76 year old female who has a history of hypertension, hyperlipidemia, type 2 diabetes mellitus, GERD, obesity, and recently had developed accelerated hypertension associated with increasing shortness of breath and diastolic dysfunction.  Her echo Doppler study showed normal systolic function with mild LVH, aortic valve sclerosis without stenosis with mild aortic insufficiency.  Clinically, she is significantly improved since her hospitalization.  However, blood pressure continues to be mildly elevated.  Her resting pulse is in the upper 70s.  I have suggested the addition of  low-dose metoprolol succinate at 12.5 mg daily added to her medical regimen which hopefully will allow for more optimal blood pressure control.  She tells me she will be seeing Dr. Debria Garret at [facility] physicians.  Of note, her LFTs were mildly elevated during her hospitalization.  If follow-up blood work confirms this.  When he checks this her atorvastatin dose may  need to be held or discontinued.  She is obese and weight loss was strongly encouraged.  Her recent lipid studies were very good with an LDL in the 60s, but her HDL was very low.  She would also need follow-up of her macrocytic anemia and would suggest B12 and folate levels be obtained with her MCV of 101.  Her primary physician will be rechecking laboratory.  She will monitor her blood pressure.  I will see her in 3-6 months for follow up evaluation as problems arise.   Medication Adjustments/Labs and Tests Ordered: Current medicines are reviewed at length with the patient today.  Concerns regarding medicines are outlined above.  Medication changes, Labs and Tests ordered today are listed in the Patient Instructions below. Patient Instructions  Your physician has recommended you make the following change in your medication:   1.) start new metoprolol succ er 25 mg prescription. As directed on the bottle. This has been sent to your pharmacy.  Your physician recommends that you schedule a follow-up appointment in: 3 months with your primary care doctor and 6 months with Dr Claiborne Billings.      Signed, Shelva Majestic, MD  10/24/2016 8:23 PM    [facility] [address], [place], [place], [place]  [address] Phone: [phone]

## 2016-10-24 ENCOUNTER — Encounter: Payer: Self-pay | Admitting: Cardiovascular Disease

## 2016-10-26 DIAGNOSIS — S76311A Strain of muscle, fascia and tendon of the posterior muscle group at thigh level, right thigh, initial encounter: Secondary | ICD-10-CM | POA: Diagnosis not present

## 2016-10-26 DIAGNOSIS — S76312A Strain of muscle, fascia and tendon of the posterior muscle group at thigh level, left thigh, initial encounter: Secondary | ICD-10-CM | POA: Diagnosis not present

## 2016-11-29 ENCOUNTER — Other Ambulatory Visit: Payer: Self-pay

## 2016-11-29 MED ORDER — METOPROLOL SUCCINATE ER 25 MG PO TB24: 12.5000 mg | ORAL_TABLET | Freq: Every day | ORAL | 3 refills | Status: AC

## 2016-12-05 DIAGNOSIS — Z6837 Body mass index (BMI) 37.0-37.9, adult: Secondary | ICD-10-CM | POA: Diagnosis not present

## 2016-12-05 DIAGNOSIS — M5440 Lumbago with sciatica, unspecified side: Secondary | ICD-10-CM | POA: Diagnosis not present

## 2016-12-10 ENCOUNTER — Other Ambulatory Visit: Payer: Self-pay | Admitting: *Deleted

## 2016-12-10 DIAGNOSIS — M545 Low back pain: Secondary | ICD-10-CM | POA: Diagnosis not present

## 2016-12-10 DIAGNOSIS — M25551 Pain in right hip: Secondary | ICD-10-CM | POA: Diagnosis not present

## 2016-12-10 DIAGNOSIS — M62551 Muscle wasting and atrophy, not elsewhere classified, right thigh: Secondary | ICD-10-CM | POA: Diagnosis not present

## 2016-12-11 DIAGNOSIS — M25551 Pain in right hip: Secondary | ICD-10-CM | POA: Diagnosis not present

## 2016-12-11 DIAGNOSIS — M62551 Muscle wasting and atrophy, not elsewhere classified, right thigh: Secondary | ICD-10-CM | POA: Diagnosis not present

## 2016-12-11 DIAGNOSIS — M545 Low back pain: Secondary | ICD-10-CM | POA: Diagnosis not present

## 2016-12-11 DIAGNOSIS — M25651 Stiffness of right hip, not elsewhere classified: Secondary | ICD-10-CM | POA: Diagnosis not present

## 2016-12-13 DIAGNOSIS — M25551 Pain in right hip: Secondary | ICD-10-CM | POA: Diagnosis not present

## 2016-12-13 DIAGNOSIS — M62551 Muscle wasting and atrophy, not elsewhere classified, right thigh: Secondary | ICD-10-CM | POA: Diagnosis not present

## 2016-12-13 DIAGNOSIS — M545 Low back pain: Secondary | ICD-10-CM | POA: Diagnosis not present

## 2016-12-13 DIAGNOSIS — M25651 Stiffness of right hip, not elsewhere classified: Secondary | ICD-10-CM | POA: Diagnosis not present

## 2016-12-18 DIAGNOSIS — M25551 Pain in right hip: Secondary | ICD-10-CM | POA: Diagnosis not present

## 2016-12-18 DIAGNOSIS — M25651 Stiffness of right hip, not elsewhere classified: Secondary | ICD-10-CM | POA: Diagnosis not present

## 2016-12-18 DIAGNOSIS — M545 Low back pain: Secondary | ICD-10-CM | POA: Diagnosis not present

## 2016-12-18 DIAGNOSIS — M62551 Muscle wasting and atrophy, not elsewhere classified, right thigh: Secondary | ICD-10-CM | POA: Diagnosis not present

## 2016-12-19 DIAGNOSIS — M25651 Stiffness of right hip, not elsewhere classified: Secondary | ICD-10-CM | POA: Diagnosis not present

## 2016-12-19 DIAGNOSIS — M62551 Muscle wasting and atrophy, not elsewhere classified, right thigh: Secondary | ICD-10-CM | POA: Diagnosis not present

## 2016-12-19 DIAGNOSIS — M545 Low back pain: Secondary | ICD-10-CM | POA: Diagnosis not present

## 2016-12-19 DIAGNOSIS — M25551 Pain in right hip: Secondary | ICD-10-CM | POA: Diagnosis not present

## 2016-12-21 DIAGNOSIS — M25651 Stiffness of right hip, not elsewhere classified: Secondary | ICD-10-CM | POA: Diagnosis not present

## 2016-12-21 DIAGNOSIS — M25551 Pain in right hip: Secondary | ICD-10-CM | POA: Diagnosis not present

## 2016-12-21 DIAGNOSIS — M545 Low back pain: Secondary | ICD-10-CM | POA: Diagnosis not present

## 2016-12-21 DIAGNOSIS — M62551 Muscle wasting and atrophy, not elsewhere classified, right thigh: Secondary | ICD-10-CM | POA: Diagnosis not present

## 2016-12-24 DIAGNOSIS — M62551 Muscle wasting and atrophy, not elsewhere classified, right thigh: Secondary | ICD-10-CM | POA: Diagnosis not present

## 2016-12-24 DIAGNOSIS — M545 Low back pain: Secondary | ICD-10-CM | POA: Diagnosis not present

## 2016-12-24 DIAGNOSIS — M25551 Pain in right hip: Secondary | ICD-10-CM | POA: Diagnosis not present

## 2016-12-24 DIAGNOSIS — M25651 Stiffness of right hip, not elsewhere classified: Secondary | ICD-10-CM | POA: Diagnosis not present

## 2016-12-26 DIAGNOSIS — M545 Low back pain: Secondary | ICD-10-CM | POA: Diagnosis not present

## 2016-12-26 DIAGNOSIS — M25551 Pain in right hip: Secondary | ICD-10-CM | POA: Diagnosis not present

## 2016-12-26 DIAGNOSIS — M25651 Stiffness of right hip, not elsewhere classified: Secondary | ICD-10-CM | POA: Diagnosis not present

## 2016-12-26 DIAGNOSIS — M62551 Muscle wasting and atrophy, not elsewhere classified, right thigh: Secondary | ICD-10-CM | POA: Diagnosis not present

## 2016-12-27 DIAGNOSIS — M545 Low back pain: Secondary | ICD-10-CM | POA: Diagnosis not present

## 2016-12-27 DIAGNOSIS — M62551 Muscle wasting and atrophy, not elsewhere classified, right thigh: Secondary | ICD-10-CM | POA: Diagnosis not present

## 2016-12-27 DIAGNOSIS — M25551 Pain in right hip: Secondary | ICD-10-CM | POA: Diagnosis not present

## 2016-12-27 DIAGNOSIS — M25651 Stiffness of right hip, not elsewhere classified: Secondary | ICD-10-CM | POA: Diagnosis not present

## 2017-01-01 DIAGNOSIS — M25651 Stiffness of right hip, not elsewhere classified: Secondary | ICD-10-CM | POA: Diagnosis not present

## 2017-01-01 DIAGNOSIS — M25551 Pain in right hip: Secondary | ICD-10-CM | POA: Diagnosis not present

## 2017-01-01 DIAGNOSIS — M545 Low back pain: Secondary | ICD-10-CM | POA: Diagnosis not present

## 2017-01-01 DIAGNOSIS — M62551 Muscle wasting and atrophy, not elsewhere classified, right thigh: Secondary | ICD-10-CM | POA: Diagnosis not present

## 2017-01-03 DIAGNOSIS — M25651 Stiffness of right hip, not elsewhere classified: Secondary | ICD-10-CM | POA: Diagnosis not present

## 2017-01-03 DIAGNOSIS — M62551 Muscle wasting and atrophy, not elsewhere classified, right thigh: Secondary | ICD-10-CM | POA: Diagnosis not present

## 2017-01-03 DIAGNOSIS — M25551 Pain in right hip: Secondary | ICD-10-CM | POA: Diagnosis not present

## 2017-01-03 DIAGNOSIS — M545 Low back pain: Secondary | ICD-10-CM | POA: Diagnosis not present

## 2017-01-07 DIAGNOSIS — M25651 Stiffness of right hip, not elsewhere classified: Secondary | ICD-10-CM | POA: Diagnosis not present

## 2017-01-07 DIAGNOSIS — M25551 Pain in right hip: Secondary | ICD-10-CM | POA: Diagnosis not present

## 2017-01-07 DIAGNOSIS — M545 Low back pain: Secondary | ICD-10-CM | POA: Diagnosis not present

## 2017-01-07 DIAGNOSIS — M62551 Muscle wasting and atrophy, not elsewhere classified, right thigh: Secondary | ICD-10-CM | POA: Diagnosis not present

## 2017-01-09 DIAGNOSIS — M25551 Pain in right hip: Secondary | ICD-10-CM | POA: Diagnosis not present

## 2017-01-09 DIAGNOSIS — M25651 Stiffness of right hip, not elsewhere classified: Secondary | ICD-10-CM | POA: Diagnosis not present

## 2017-01-09 DIAGNOSIS — M62551 Muscle wasting and atrophy, not elsewhere classified, right thigh: Secondary | ICD-10-CM | POA: Diagnosis not present

## 2017-01-09 DIAGNOSIS — M545 Low back pain: Secondary | ICD-10-CM | POA: Diagnosis not present

## 2017-01-29 DIAGNOSIS — Z23 Encounter for immunization: Secondary | ICD-10-CM | POA: Diagnosis not present

## 2017-02-04 DIAGNOSIS — H25813 Combined forms of age-related cataract, bilateral: Secondary | ICD-10-CM | POA: Diagnosis not present

## 2017-02-04 DIAGNOSIS — Z0100 Encounter for examination of eyes and vision without abnormal findings: Secondary | ICD-10-CM | POA: Diagnosis not present

## 2017-02-04 DIAGNOSIS — E119 Type 2 diabetes mellitus without complications: Secondary | ICD-10-CM | POA: Diagnosis not present

## 2017-04-23 DIAGNOSIS — E782 Mixed hyperlipidemia: Secondary | ICD-10-CM | POA: Diagnosis not present

## 2017-04-23 DIAGNOSIS — Z79899 Other long term (current) drug therapy: Secondary | ICD-10-CM | POA: Diagnosis not present

## 2017-04-23 DIAGNOSIS — M545 Low back pain: Secondary | ICD-10-CM | POA: Diagnosis not present

## 2017-04-23 DIAGNOSIS — E119 Type 2 diabetes mellitus without complications: Secondary | ICD-10-CM | POA: Diagnosis not present

## 2017-04-23 DIAGNOSIS — I10 Essential (primary) hypertension: Secondary | ICD-10-CM | POA: Diagnosis not present

## 2017-04-23 DIAGNOSIS — E039 Hypothyroidism, unspecified: Secondary | ICD-10-CM | POA: Diagnosis not present

## 2017-04-23 DIAGNOSIS — I872 Venous insufficiency (chronic) (peripheral): Secondary | ICD-10-CM | POA: Diagnosis not present

## 2017-04-23 DIAGNOSIS — E538 Deficiency of other specified B group vitamins: Secondary | ICD-10-CM | POA: Diagnosis not present

## 2017-04-23 DIAGNOSIS — G8929 Other chronic pain: Secondary | ICD-10-CM | POA: Diagnosis not present

## 2017-09-02 DIAGNOSIS — I872 Venous insufficiency (chronic) (peripheral): Secondary | ICD-10-CM | POA: Diagnosis not present

## 2017-09-02 DIAGNOSIS — M79606 Pain in leg, unspecified: Secondary | ICD-10-CM | POA: Diagnosis not present

## 2017-09-02 DIAGNOSIS — Z0000 Encounter for general adult medical examination without abnormal findings: Secondary | ICD-10-CM | POA: Diagnosis not present

## 2017-09-02 DIAGNOSIS — Z6838 Body mass index (BMI) 38.0-38.9, adult: Secondary | ICD-10-CM | POA: Diagnosis not present

## 2017-09-16 DIAGNOSIS — Z79899 Other long term (current) drug therapy: Secondary | ICD-10-CM | POA: Diagnosis not present

## 2017-09-16 DIAGNOSIS — E782 Mixed hyperlipidemia: Secondary | ICD-10-CM | POA: Diagnosis not present

## 2017-09-16 DIAGNOSIS — E119 Type 2 diabetes mellitus without complications: Secondary | ICD-10-CM | POA: Diagnosis not present

## 2017-09-19 DIAGNOSIS — E119 Type 2 diabetes mellitus without complications: Secondary | ICD-10-CM | POA: Diagnosis not present

## 2017-10-24 DIAGNOSIS — Z6838 Body mass index (BMI) 38.0-38.9, adult: Secondary | ICD-10-CM | POA: Diagnosis not present

## 2017-10-24 DIAGNOSIS — R109 Unspecified abdominal pain: Secondary | ICD-10-CM | POA: Diagnosis not present

## 2017-10-24 DIAGNOSIS — H8111 Benign paroxysmal vertigo, right ear: Secondary | ICD-10-CM | POA: Diagnosis not present

## 2017-10-31 DIAGNOSIS — R1032 Left lower quadrant pain: Secondary | ICD-10-CM

## 2017-10-31 HISTORY — DX: Left lower quadrant pain: R10.32

## 2017-11-01 DIAGNOSIS — R1032 Left lower quadrant pain: Secondary | ICD-10-CM | POA: Diagnosis not present

## 2017-11-07 DIAGNOSIS — Z794 Long term (current) use of insulin: Secondary | ICD-10-CM | POA: Diagnosis not present

## 2017-11-07 DIAGNOSIS — Z7984 Long term (current) use of oral hypoglycemic drugs: Secondary | ICD-10-CM | POA: Diagnosis not present

## 2017-11-07 DIAGNOSIS — E1165 Type 2 diabetes mellitus with hyperglycemia: Secondary | ICD-10-CM | POA: Diagnosis not present

## 2017-11-07 DIAGNOSIS — Z7982 Long term (current) use of aspirin: Secondary | ICD-10-CM | POA: Diagnosis not present

## 2017-11-07 DIAGNOSIS — R1032 Left lower quadrant pain: Secondary | ICD-10-CM | POA: Diagnosis not present

## 2017-11-07 DIAGNOSIS — Z79899 Other long term (current) drug therapy: Secondary | ICD-10-CM | POA: Diagnosis not present

## 2017-11-07 DIAGNOSIS — I10 Essential (primary) hypertension: Secondary | ICD-10-CM | POA: Diagnosis not present

## 2017-11-27 DIAGNOSIS — R109 Unspecified abdominal pain: Secondary | ICD-10-CM | POA: Diagnosis not present

## 2017-11-27 DIAGNOSIS — E119 Type 2 diabetes mellitus without complications: Secondary | ICD-10-CM | POA: Diagnosis not present

## 2017-11-27 DIAGNOSIS — Z01818 Encounter for other preprocedural examination: Secondary | ICD-10-CM | POA: Diagnosis not present

## 2017-11-29 DIAGNOSIS — M5430 Sciatica, unspecified side: Secondary | ICD-10-CM | POA: Diagnosis not present

## 2017-12-26 DIAGNOSIS — K635 Polyp of colon: Secondary | ICD-10-CM | POA: Diagnosis not present

## 2017-12-26 DIAGNOSIS — R1011 Right upper quadrant pain: Secondary | ICD-10-CM | POA: Diagnosis not present

## 2017-12-26 DIAGNOSIS — R109 Unspecified abdominal pain: Secondary | ICD-10-CM | POA: Diagnosis not present

## 2017-12-26 DIAGNOSIS — D126 Benign neoplasm of colon, unspecified: Secondary | ICD-10-CM | POA: Diagnosis not present

## 2017-12-26 DIAGNOSIS — K644 Residual hemorrhoidal skin tags: Secondary | ICD-10-CM | POA: Diagnosis not present

## 2017-12-26 DIAGNOSIS — E079 Disorder of thyroid, unspecified: Secondary | ICD-10-CM | POA: Diagnosis not present

## 2017-12-26 DIAGNOSIS — D649 Anemia, unspecified: Secondary | ICD-10-CM | POA: Diagnosis not present

## 2017-12-26 DIAGNOSIS — K648 Other hemorrhoids: Secondary | ICD-10-CM | POA: Diagnosis not present

## 2017-12-26 DIAGNOSIS — I10 Essential (primary) hypertension: Secondary | ICD-10-CM | POA: Diagnosis not present

## 2017-12-26 DIAGNOSIS — K219 Gastro-esophageal reflux disease without esophagitis: Secondary | ICD-10-CM | POA: Diagnosis not present

## 2017-12-26 DIAGNOSIS — E119 Type 2 diabetes mellitus without complications: Secondary | ICD-10-CM | POA: Diagnosis not present

## 2018-01-30 DIAGNOSIS — L820 Inflamed seborrheic keratosis: Secondary | ICD-10-CM | POA: Diagnosis not present

## 2018-01-30 DIAGNOSIS — Z23 Encounter for immunization: Secondary | ICD-10-CM | POA: Diagnosis not present

## 2018-02-04 DIAGNOSIS — M79662 Pain in left lower leg: Secondary | ICD-10-CM | POA: Diagnosis not present

## 2018-02-13 DIAGNOSIS — R911 Solitary pulmonary nodule: Secondary | ICD-10-CM | POA: Diagnosis not present

## 2018-02-13 DIAGNOSIS — R918 Other nonspecific abnormal finding of lung field: Secondary | ICD-10-CM | POA: Diagnosis not present

## 2018-02-20 DIAGNOSIS — M7062 Trochanteric bursitis, left hip: Secondary | ICD-10-CM | POA: Diagnosis not present

## 2018-02-23 DIAGNOSIS — E7849 Other hyperlipidemia: Secondary | ICD-10-CM | POA: Diagnosis not present

## 2018-02-23 DIAGNOSIS — R0609 Other forms of dyspnea: Secondary | ICD-10-CM | POA: Diagnosis not present

## 2018-02-23 DIAGNOSIS — E039 Hypothyroidism, unspecified: Secondary | ICD-10-CM | POA: Diagnosis not present

## 2018-02-23 DIAGNOSIS — E119 Type 2 diabetes mellitus without complications: Secondary | ICD-10-CM | POA: Diagnosis not present

## 2018-02-23 DIAGNOSIS — Z7982 Long term (current) use of aspirin: Secondary | ICD-10-CM | POA: Diagnosis not present

## 2018-02-23 DIAGNOSIS — R079 Chest pain, unspecified: Secondary | ICD-10-CM | POA: Diagnosis not present

## 2018-02-23 DIAGNOSIS — R5383 Other fatigue: Secondary | ICD-10-CM | POA: Diagnosis not present

## 2018-02-23 DIAGNOSIS — K219 Gastro-esophageal reflux disease without esophagitis: Secondary | ICD-10-CM | POA: Diagnosis not present

## 2018-02-23 DIAGNOSIS — R0789 Other chest pain: Secondary | ICD-10-CM | POA: Diagnosis not present

## 2018-02-23 DIAGNOSIS — J029 Acute pharyngitis, unspecified: Secondary | ICD-10-CM | POA: Diagnosis not present

## 2018-02-23 DIAGNOSIS — Z79899 Other long term (current) drug therapy: Secondary | ICD-10-CM | POA: Diagnosis not present

## 2018-02-23 DIAGNOSIS — R0600 Dyspnea, unspecified: Secondary | ICD-10-CM | POA: Diagnosis not present

## 2018-02-23 DIAGNOSIS — I160 Hypertensive urgency: Secondary | ICD-10-CM | POA: Diagnosis not present

## 2018-02-23 DIAGNOSIS — Z794 Long term (current) use of insulin: Secondary | ICD-10-CM | POA: Diagnosis not present

## 2018-02-23 DIAGNOSIS — E785 Hyperlipidemia, unspecified: Secondary | ICD-10-CM | POA: Diagnosis not present

## 2018-02-24 DIAGNOSIS — E785 Hyperlipidemia, unspecified: Secondary | ICD-10-CM | POA: Diagnosis not present

## 2018-02-24 DIAGNOSIS — R0789 Other chest pain: Secondary | ICD-10-CM | POA: Diagnosis not present

## 2018-02-24 DIAGNOSIS — I160 Hypertensive urgency: Secondary | ICD-10-CM | POA: Diagnosis not present

## 2018-02-24 DIAGNOSIS — R0600 Dyspnea, unspecified: Secondary | ICD-10-CM | POA: Diagnosis not present

## 2018-02-24 DIAGNOSIS — E119 Type 2 diabetes mellitus without complications: Secondary | ICD-10-CM | POA: Diagnosis not present

## 2018-02-24 DIAGNOSIS — R079 Chest pain, unspecified: Secondary | ICD-10-CM

## 2018-02-24 DIAGNOSIS — K219 Gastro-esophageal reflux disease without esophagitis: Secondary | ICD-10-CM | POA: Diagnosis not present

## 2018-03-03 DIAGNOSIS — E1165 Type 2 diabetes mellitus with hyperglycemia: Secondary | ICD-10-CM | POA: Diagnosis not present

## 2018-03-03 DIAGNOSIS — Z87898 Personal history of other specified conditions: Secondary | ICD-10-CM | POA: Diagnosis not present

## 2018-03-03 DIAGNOSIS — E782 Mixed hyperlipidemia: Secondary | ICD-10-CM | POA: Diagnosis not present

## 2018-03-03 DIAGNOSIS — Z6837 Body mass index (BMI) 37.0-37.9, adult: Secondary | ICD-10-CM | POA: Diagnosis not present

## 2018-03-10 DIAGNOSIS — M545 Low back pain: Secondary | ICD-10-CM | POA: Diagnosis not present

## 2018-03-10 DIAGNOSIS — R2689 Other abnormalities of gait and mobility: Secondary | ICD-10-CM | POA: Diagnosis not present

## 2018-03-10 DIAGNOSIS — M6281 Muscle weakness (generalized): Secondary | ICD-10-CM | POA: Diagnosis not present

## 2018-03-10 DIAGNOSIS — M7062 Trochanteric bursitis, left hip: Secondary | ICD-10-CM | POA: Diagnosis not present

## 2018-03-17 ENCOUNTER — Ambulatory Visit: Payer: Medicare HMO | Admitting: Cardiology

## 2018-03-19 DIAGNOSIS — M6281 Muscle weakness (generalized): Secondary | ICD-10-CM | POA: Diagnosis not present

## 2018-03-19 DIAGNOSIS — R2689 Other abnormalities of gait and mobility: Secondary | ICD-10-CM | POA: Diagnosis not present

## 2018-03-19 DIAGNOSIS — M545 Low back pain: Secondary | ICD-10-CM | POA: Diagnosis not present

## 2018-03-19 DIAGNOSIS — M7062 Trochanteric bursitis, left hip: Secondary | ICD-10-CM | POA: Diagnosis not present

## 2018-03-24 DIAGNOSIS — M545 Low back pain: Secondary | ICD-10-CM | POA: Diagnosis not present

## 2018-03-24 DIAGNOSIS — M7062 Trochanteric bursitis, left hip: Secondary | ICD-10-CM | POA: Diagnosis not present

## 2018-03-24 DIAGNOSIS — R2689 Other abnormalities of gait and mobility: Secondary | ICD-10-CM | POA: Diagnosis not present

## 2018-03-24 DIAGNOSIS — M6281 Muscle weakness (generalized): Secondary | ICD-10-CM | POA: Diagnosis not present

## 2018-03-26 ENCOUNTER — Encounter: Payer: Self-pay | Admitting: *Deleted

## 2018-03-26 ENCOUNTER — Ambulatory Visit: Payer: Medicare HMO | Admitting: Cardiology

## 2018-03-26 VITALS — BP 160/64 | HR 76 | Ht 60.0 in | Wt 193.6 lb

## 2018-03-26 DIAGNOSIS — E782 Mixed hyperlipidemia: Secondary | ICD-10-CM

## 2018-03-26 DIAGNOSIS — I10 Essential (primary) hypertension: Secondary | ICD-10-CM | POA: Diagnosis not present

## 2018-03-26 DIAGNOSIS — E119 Type 2 diabetes mellitus without complications: Secondary | ICD-10-CM

## 2018-03-26 NOTE — Patient Instructions (Signed)
Medication Instructions:  Your physician recommends that you continue on your current medications as directed. Please refer to the Current Medication list given to you today.  If you need a refill on your cardiac medications before your next appointment, please call your pharmacy.   Lab work: None  If you have labs (blood work) drawn today and your tests are completely normal, you will receive your results only by: . MyChart Message (if you have MyChart) OR . A paper copy in the mail If you have any lab test that is abnormal or we need to change your treatment, we will call you to review the results.  Testing/Procedures: None  Follow-Up: At [facility], you and your health needs are our priority.  As part of our continuing mission to provide you with exceptional heart care, we have created designated Provider Care Teams.  These Care Teams include your primary Cardiologist (physician) and Advanced Practice Providers (APPs -  Physician Assistants and Nurse Practitioners) who all work together to provide you with the care you need, when you need it.  You will need a follow up appointment in 6 months.  Please call our office 2 months in advance to schedule this appointment.  You may see another member of our [facility] Provider Team in [place]: Robert Krasowski, MD . Brian Munley, MD  Any Other Special Instructions Will Be Listed Below (If Applicable).    

## 2018-03-26 NOTE — Progress Notes (Signed)
Cardiology Office Note:    Date:  03/26/2018   ID:  [name], DOB 1941/02/11, MRN [id number]  PCP:  Mateo Flow, MD  Cardiologist:  Jenean Lindau, MD   Referring MD: Mateo Flow, MD    ASSESSMENT:    1. Essential hypertension   2. Mixed hyperlipidemia   3. Diabetes mellitus without complication ([place])    PLAN:    In order of problems listed above:  1. Primary prevention stressed with the patient.  Importance of compliance with diet and medication stressed and she vocalized understanding.  Her blood pressure is stable.  Diet was discussed for dyslipidemia and diabetes mellitus.  Risks of obesity explained.  I advised her about exercise and this was discussed at great length and she plans to diet and exercise to lose weight.  She promises to do better. 2. She tells me that her blood pressure is much better at home and she has an element of whitecoat hypertension.  She is going to keep a track of her blood pressures and send them to me in a week or 2 and I will review them. 3. Patient will be seen in follow-up appointment in 6 months or earlier if the patient has any concerns.  She knows to go to the nearest emergency room for any significant concerns.   Medication Adjustments/Labs and Tests Ordered: Current medicines are reviewed at length with the patient today.  Concerns regarding medicines are outlined above.  No orders of the defined types were placed in this encounter.  No orders of the defined types were placed in this encounter.    History of Present Illness:    [name] is a 77 y.o. female who is being seen today for the evaluation of chest discomfort at the request of Mateo Flow, MD.  Patient is a pleasant 77 year old female.  She has past medical history of essential hypertension, dyslipidemia diabetes mellitus.  She is morbidly obese and leads a sedentary lifestyle.  She went to [facility] for evaluation of chest pain and this was  unremarkable.  There was no evidence of ischemia.  Subsequently she has been released and she has tried walking on a regular basis without any symptoms.  At the time of my evaluation, the patient is alert awake oriented and in no distress.  For the above reason she was referred to me.  Past Medical History:  Diagnosis Date  . Diabetes mellitus without complication ([place])   . GERD (gastroesophageal reflux disease)   . History of cardiac catheterization    a. normal cors by cath in 2007.  Marland Kitchen HLD (hyperlipidemia)   . Hypertension   . Hypothyroidism     Past Surgical History:  Procedure Laterality Date  . ABDOMINAL HYSTERECTOMY    . NECK SURGERY      Current Medications: Current Meds  Medication Sig  . ALPRAZolam (XANAX) 1 MG tablet Take 1 mg by mouth as needed for anxiety.  Marland Kitchen amLODipine (NORVASC) 10 MG tablet Take 1 tablet (10 mg total) by mouth daily.  Marland Kitchen aspirin EC 81 MG tablet Take 81 mg by mouth daily.  Marland Kitchen atorvastatin (LIPITOR) 20 MG tablet Take 20 mg by mouth at bedtime.   . gabapentin (NEURONTIN) 600 MG tablet Take 600-1,200 mg by mouth 2 (two) times daily. Take 600 mg in the morning and 1200 mg at night  . glimepiride (AMARYL) 1 MG tablet Take 4 mg by mouth daily.  . hydrochlorothiazide (HYDRODIURIL) 25 MG  tablet Take 1 tablet (25 mg total) by mouth daily.  . Insulin Glargine (LANTUS SOLOSTAR) 100 UNIT/ML Solostar Pen Inject into the skin.  Marland Kitchen levothyroxine (SYNTHROID, LEVOTHROID) 75 MCG tablet Take 75 mcg by mouth daily.  Marland Kitchen loratadine (CLARITIN) 10 MG tablet Take 10 mg by mouth daily.  Marland Kitchen losartan (COZAAR) 100 MG tablet Take 100 mg by mouth daily.  . metFORMIN (GLUCOPHAGE-XR) 750 MG 24 hr tablet Take 1,500 mg by mouth daily.  . metoprolol succinate (TOPROL XL) 25 MG 24 hr tablet Take 0.5 tablets (12.5 mg total) by mouth daily.  . Multiple Vitamin (MULTI-VITAMINS) TABS Take 1 tablet by mouth daily.  . Omega-3 1000 MG CAPS Take by mouth.  . pantoprazole (PROTONIX) 40 MG tablet Take  40 mg by mouth daily.  . traMADol (ULTRAM) 50 MG tablet Take by mouth.  . triamcinolone cream (KENALOG) 0.1 % Apply topically.  . [DISCONTINUED] vitamin B-12 (CYANOCOBALAMIN) 1000 MCG tablet Take 1 tablet (1,000 mcg total) by mouth daily.     Allergies:   Iohexol; Morphine; Iodinated diagnostic agents; Simvastatin; and Codeine   Social History   Socioeconomic History  . Marital status: Married    Spouse name: Not on file  . Number of children: Not on file  . Years of education: Not on file  . Highest education level: Not on file  Occupational History  . Not on file  Social Needs  . Financial resource strain: Not on file  . Food insecurity:    Worry: Not on file    Inability: Not on file  . Transportation needs:    Medical: Not on file    Non-medical: Not on file  Tobacco Use  . Smoking status: Never Smoker  . Smokeless tobacco: Never Used  Substance and Sexual Activity  . Alcohol use: No  . Drug use: No  . Sexual activity: Not on file  Lifestyle  . Physical activity:    Days per week: Not on file    Minutes per session: Not on file  . Stress: Not on file  Relationships  . Social connections:    Talks on phone: Not on file    Gets together: Not on file    Attends religious service: Not on file    Active member of club or organization: Not on file    Attends meetings of clubs or organizations: Not on file    Relationship status: Not on file  Other Topics Concern  . Not on file  Social History Narrative  . Not on file     Family History: The patient's family history includes Cancer in her brother; Cerebral aneurysm in her mother; Hypertension in her mother; Pancreatic cancer in her father.  ROS:   Please see the history of present illness.    All other systems reviewed and are negative.  EKGs/Labs/Other Studies Reviewed:    The following studies were reviewed today: I reviewed [facility] records and they were unremarkable and stress test did not  reveal any evidence of ischemia with preserved ejection fraction.   Recent Labs: No results found for requested labs within last 8760 hours.  Recent Lipid Panel    Component Value Date/Time   CHOL 121 09/28/2016 0245   TRIG 142 09/28/2016 0245   HDL 26 (L) 09/28/2016 0245   CHOLHDL 4.7 09/28/2016 0245   VLDL 28 09/28/2016 0245   LDLCALC 67 09/28/2016 0245    Physical Exam:    VS:  BP (!) 160/64 (BP Location: Left Arm,  Patient Position: Sitting)   Pulse 76   Ht 5' (1.524 m)   Wt 193 lb 9.6 oz (87.8 kg)   BMI 37.81 kg/m     Wt Readings from Last 3 Encounters:  03/26/18 193 lb 9.6 oz (87.8 kg)  10/22/16 194 lb 12.8 oz (88.4 kg)  09/27/16 183 lb 1.6 oz (83.1 kg)     GEN: Patient is in no acute distress HEENT: Normal NECK: No JVD; No carotid bruits LYMPHATICS: No lymphadenopathy CARDIAC: S1 S2 regular, 2/6 systolic murmur at the apex. RESPIRATORY:  Clear to auscultation without rales, wheezing or rhonchi  ABDOMEN: Soft, non-tender, non-distended MUSCULOSKELETAL:  No edema; No deformity  SKIN: Warm and dry NEUROLOGIC:  Alert and oriented x 3 PSYCHIATRIC:  Normal affect    Signed, Jenean Lindau, MD  03/26/2018 11:18 AM    [place]

## 2018-03-31 DIAGNOSIS — M545 Low back pain: Secondary | ICD-10-CM | POA: Diagnosis not present

## 2018-03-31 DIAGNOSIS — R2689 Other abnormalities of gait and mobility: Secondary | ICD-10-CM | POA: Diagnosis not present

## 2018-03-31 DIAGNOSIS — M6281 Muscle weakness (generalized): Secondary | ICD-10-CM | POA: Diagnosis not present

## 2018-03-31 DIAGNOSIS — M7062 Trochanteric bursitis, left hip: Secondary | ICD-10-CM | POA: Diagnosis not present

## 2018-04-14 DIAGNOSIS — R2689 Other abnormalities of gait and mobility: Secondary | ICD-10-CM | POA: Diagnosis not present

## 2018-04-14 DIAGNOSIS — M6281 Muscle weakness (generalized): Secondary | ICD-10-CM | POA: Diagnosis not present

## 2018-04-14 DIAGNOSIS — M545 Low back pain: Secondary | ICD-10-CM | POA: Diagnosis not present

## 2018-04-14 DIAGNOSIS — M7062 Trochanteric bursitis, left hip: Secondary | ICD-10-CM | POA: Diagnosis not present

## 2018-06-04 DIAGNOSIS — E1165 Type 2 diabetes mellitus with hyperglycemia: Secondary | ICD-10-CM | POA: Diagnosis not present

## 2018-06-04 DIAGNOSIS — Z9181 History of falling: Secondary | ICD-10-CM | POA: Diagnosis not present

## 2018-06-06 DIAGNOSIS — E119 Type 2 diabetes mellitus without complications: Secondary | ICD-10-CM | POA: Diagnosis not present

## 2018-09-11 IMAGING — DX DG CHEST 2V
2 series · 2 of 2 positions shown · non-contrast
Comparison: November 02, 2013

CLINICAL DATA: Chest pain for 3 weeks.

EXAM:
CHEST  2 VIEW

[chest pa]
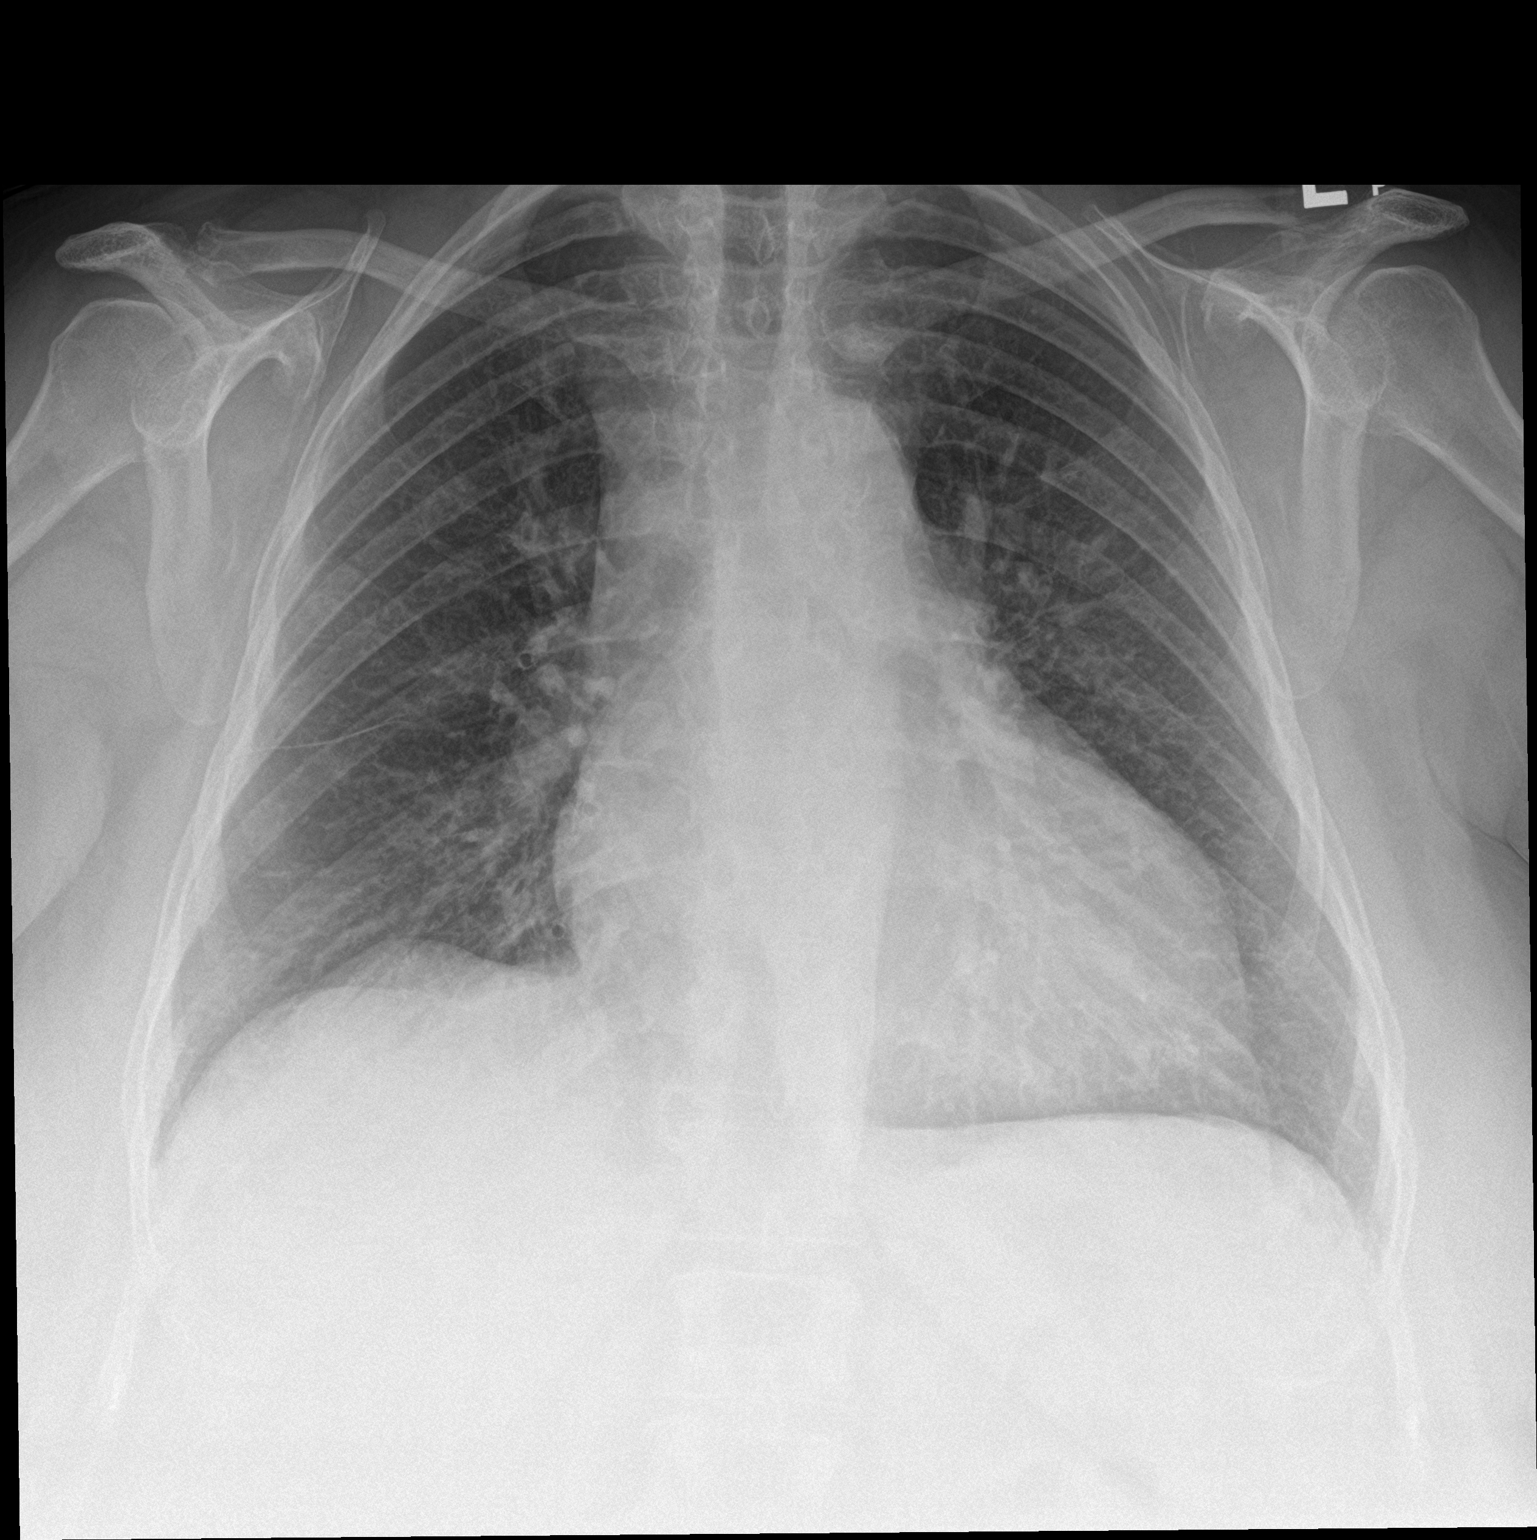

[chest lat]
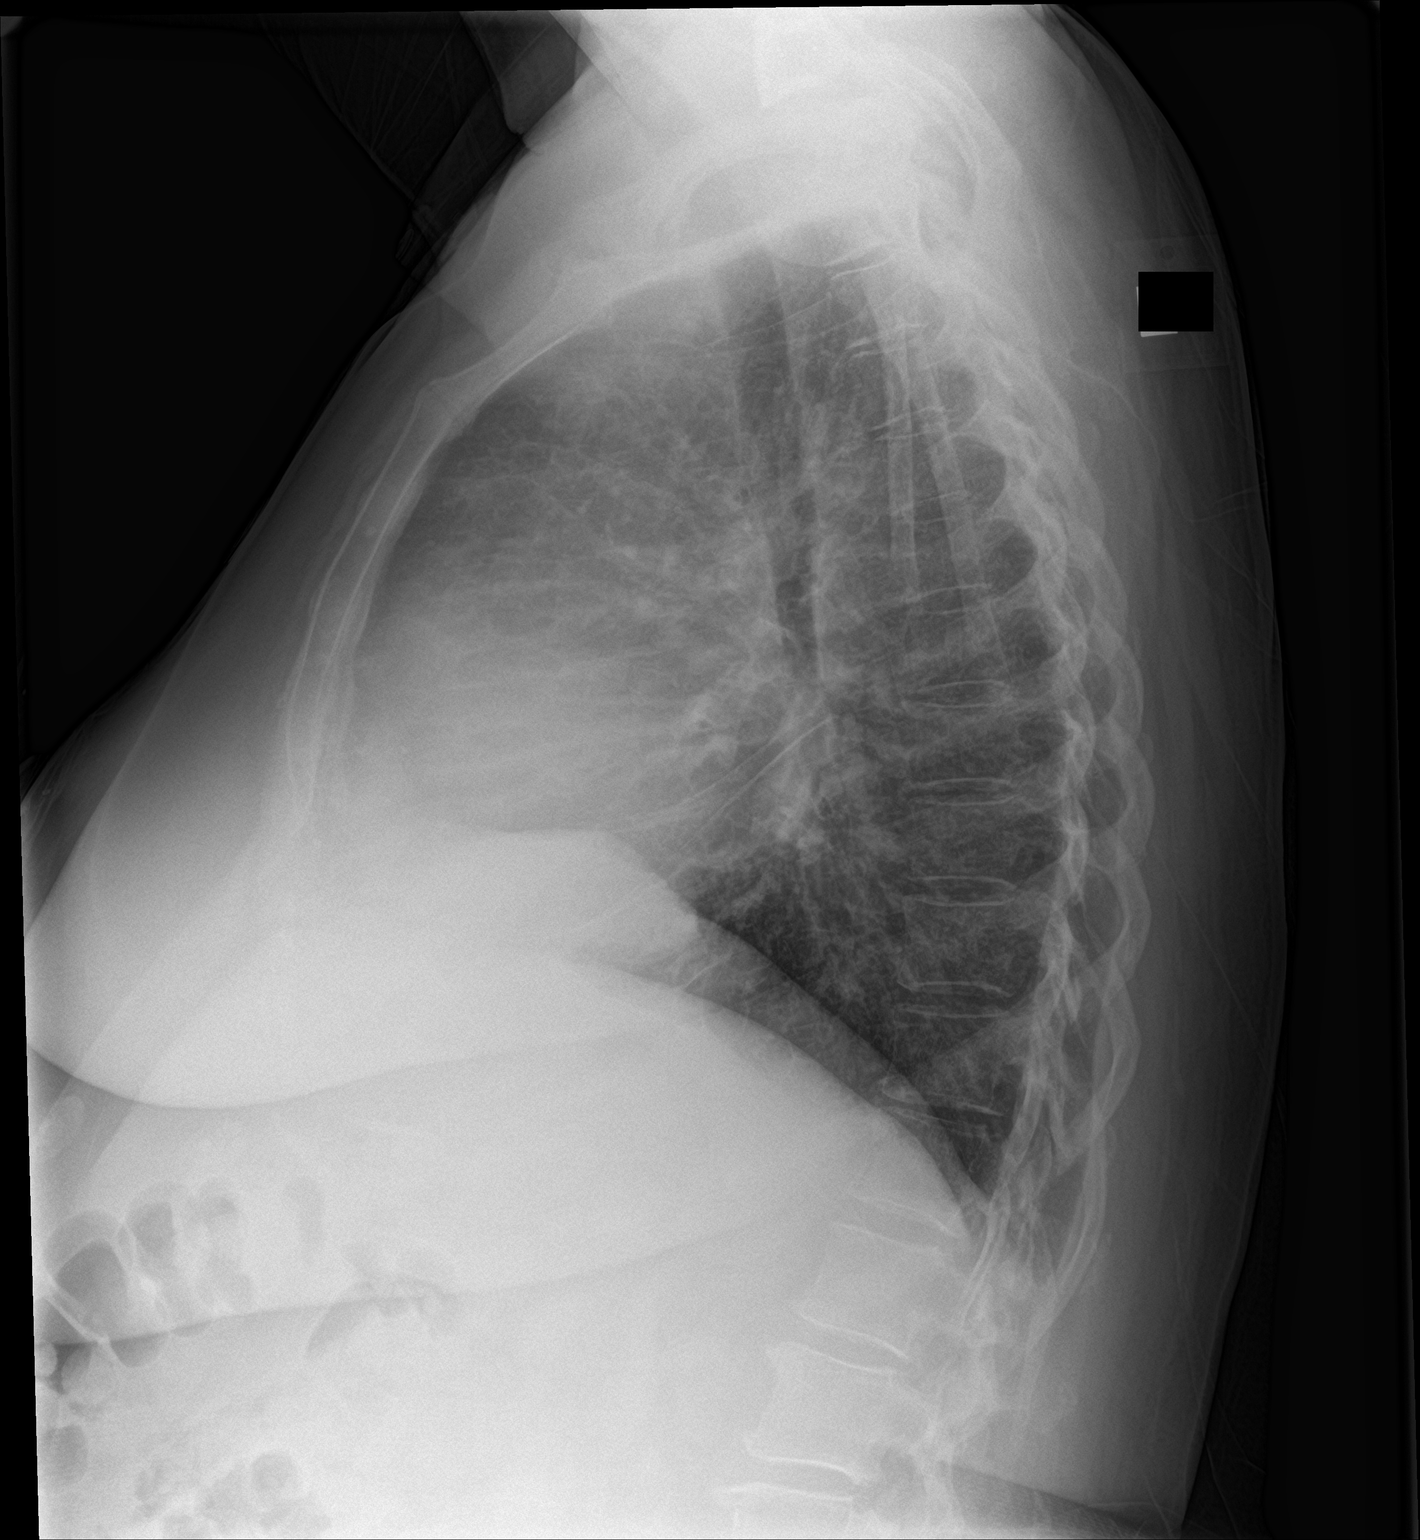

[2 of 2 positions shown; findings below may reference images not displayed]

FINDINGS: The heart size and mediastinal contours are stable. The heart size
is enlarged. Both lungs are clear. The visualized skeletal
structures are unremarkable.
IMPRESSION: No active cardiopulmonary disease.

## 2018-09-18 DIAGNOSIS — Z0000 Encounter for general adult medical examination without abnormal findings: Secondary | ICD-10-CM | POA: Diagnosis not present

## 2018-09-18 DIAGNOSIS — E1165 Type 2 diabetes mellitus with hyperglycemia: Secondary | ICD-10-CM | POA: Diagnosis not present

## 2018-09-18 DIAGNOSIS — D649 Anemia, unspecified: Secondary | ICD-10-CM | POA: Diagnosis not present

## 2018-09-18 DIAGNOSIS — R609 Edema, unspecified: Secondary | ICD-10-CM | POA: Diagnosis not present

## 2018-09-23 DIAGNOSIS — M25512 Pain in left shoulder: Secondary | ICD-10-CM | POA: Diagnosis not present

## 2018-11-24 DIAGNOSIS — E782 Mixed hyperlipidemia: Secondary | ICD-10-CM | POA: Diagnosis not present

## 2018-11-24 DIAGNOSIS — Z79899 Other long term (current) drug therapy: Secondary | ICD-10-CM | POA: Diagnosis not present

## 2018-11-24 DIAGNOSIS — E1165 Type 2 diabetes mellitus with hyperglycemia: Secondary | ICD-10-CM | POA: Diagnosis not present

## 2019-01-15 DIAGNOSIS — E538 Deficiency of other specified B group vitamins: Secondary | ICD-10-CM | POA: Diagnosis not present

## 2019-01-15 DIAGNOSIS — E1169 Type 2 diabetes mellitus with other specified complication: Secondary | ICD-10-CM | POA: Diagnosis not present

## 2019-01-15 DIAGNOSIS — Z79899 Other long term (current) drug therapy: Secondary | ICD-10-CM | POA: Diagnosis not present

## 2019-01-15 DIAGNOSIS — D649 Anemia, unspecified: Secondary | ICD-10-CM | POA: Diagnosis not present

## 2019-01-24 DIAGNOSIS — Z23 Encounter for immunization: Secondary | ICD-10-CM | POA: Diagnosis not present

## 2019-01-28 ENCOUNTER — Encounter: Payer: Self-pay | Admitting: Cardiology

## 2019-01-28 ENCOUNTER — Other Ambulatory Visit: Payer: Self-pay

## 2019-01-28 ENCOUNTER — Ambulatory Visit (INDEPENDENT_AMBULATORY_CARE_PROVIDER_SITE_OTHER): Payer: Medicare Other | Admitting: Cardiology

## 2019-01-28 VITALS — BP 132/64 | HR 68 | Ht <= 58 in | Wt 184.4 lb

## 2019-01-28 DIAGNOSIS — E119 Type 2 diabetes mellitus without complications: Secondary | ICD-10-CM | POA: Diagnosis not present

## 2019-01-28 DIAGNOSIS — R0600 Dyspnea, unspecified: Secondary | ICD-10-CM | POA: Diagnosis not present

## 2019-01-28 DIAGNOSIS — E782 Mixed hyperlipidemia: Secondary | ICD-10-CM

## 2019-01-28 DIAGNOSIS — R0609 Other forms of dyspnea: Secondary | ICD-10-CM

## 2019-01-28 DIAGNOSIS — I10 Essential (primary) hypertension: Secondary | ICD-10-CM

## 2019-01-28 HISTORY — DX: Morbid (severe) obesity due to excess calories: E66.01

## 2019-01-28 HISTORY — DX: Essential (primary) hypertension: I10

## 2019-01-28 NOTE — Patient Instructions (Signed)
Medication Instructions:  Your physician recommends that you continue on your current medications as directed. Please refer to the Current Medication list given to you today.   If you need a refill on your cardiac medications before your next appointment, please call your pharmacy.   Lab work: NONE If you have labs (blood work) drawn today and your tests are completely normal, you will receive your results only by: . MyChart Message (if you have MyChart) OR . A paper copy in the mail If you have any lab test that is abnormal or we need to change your treatment, we will call you to review the results.  Testing/Procedures: NONE  Follow-Up: At [facility], you and your health needs are our priority.  As part of our continuing mission to provide you with exceptional heart care, we have created designated Provider Care Teams.  These Care Teams include your primary Cardiologist (physician) and Advanced Practice Providers (APPs -  Physician Assistants and Nurse Practitioners) who all work together to provide you with the care you need, when you need it. You will need a follow up appointment in 9 months.   

## 2019-01-28 NOTE — Progress Notes (Signed)
Cardiology Office Note:    Date:  01/28/2019   ID:  [name], DOB 01-Jan-1941, MRN [id number]  PCP:  Mateo Flow, MD  Cardiologist:  Jenean Lindau, MD   Referring MD: Mateo Flow, MD    ASSESSMENT:    1. Dyspnea on exertion   2. Diabetes mellitus without complication ([place])   3. Mixed hyperlipidemia   4. Essential hypertension   5. Morbid obesity ([place])    PLAN:    In order of problems listed above:  1. Dyspnea on exertion: She is stable with the symptoms and is trying to increase her effort tolerance.  She is happy about it. 2. Essential hypertension: Blood pressure stable 3. Mixed dyslipidemia: Diet was discussed and also weight reduction was stressed and risk of obesity explained.  I did extensive discussion with her about diabetes mellitus and diet also.  This is managed by her primary care physician.  Risks of obesity explained. 4. Patient will be seen in follow-up appointment in 9 months or earlier if the patient has any concerns    Medication Adjustments/Labs and Tests Ordered: Current medicines are reviewed at length with the patient today.  Concerns regarding medicines are outlined above.  No orders of the defined types were placed in this encounter.  No orders of the defined types were placed in this encounter.    No chief complaint on file.    History of Present Illness:    [name] is a 78 y.o. female.  Patient has past medical history of essential hypertension and dyslipidemia and morbid obesity.  She is a diabetic.  She leads a fairly sedentary lifestyle but is trying to get active and lose weight.  No chest pain orthopnea or PND.  At the time of my evaluation, the patient is alert awake oriented and in no distress.  Past Medical History:  Diagnosis Date  . Diabetes mellitus without complication ([place])   . GERD (gastroesophageal reflux disease)   . History of cardiac catheterization    a. normal cors by cath in 2007.  Marland Kitchen HLD  (hyperlipidemia)   . Hypertension   . Hypothyroidism     Past Surgical History:  Procedure Laterality Date  . ABDOMINAL HYSTERECTOMY    . NECK SURGERY      Current Medications: Current Meds  Medication Sig  . amLODipine (NORVASC) 10 MG tablet Take 1 tablet (10 mg total) by mouth daily.  Marland Kitchen aspirin EC 81 MG tablet Take 81 mg by mouth daily.  Marland Kitchen gabapentin (NEURONTIN) 600 MG tablet Take 600-1,200 mg by mouth 2 (two) times daily. Take 600 mg in the morning and 1200 mg at night  . glimepiride (AMARYL) 1 MG tablet Take 4 mg by mouth daily.  . hydrochlorothiazide (HYDRODIURIL) 25 MG tablet Take 1 tablet (25 mg total) by mouth daily.  . Insulin Glargine (LANTUS SOLOSTAR) 100 UNIT/ML Solostar Pen Inject into the skin.  Marland Kitchen JARDIANCE 10 MG TABS tablet   . levothyroxine (SYNTHROID, LEVOTHROID) 75 MCG tablet Take 75 mcg by mouth daily.  Marland Kitchen loratadine (CLARITIN) 10 MG tablet Take 10 mg by mouth daily.  Marland Kitchen losartan (COZAAR) 100 MG tablet Take 100 mg by mouth daily.  . metFORMIN (GLUCOPHAGE-XR) 750 MG 24 hr tablet Take 1,500 mg by mouth daily.  . metoprolol succinate (TOPROL XL) 25 MG 24 hr tablet Take 0.5 tablets (12.5 mg total) by mouth daily.  . Multiple Vitamin (MULTI-VITAMINS) TABS Take 1 tablet by mouth daily.  . Omega-3 1000  MG CAPS Take by mouth.  . triamcinolone cream (KENALOG) 0.1 % Apply topically.  . TRUEplus Lancets 28G MISC   . [DISCONTINUED] atorvastatin (LIPITOR) 20 MG tablet Take 20 mg by mouth at bedtime.      Allergies:   Iohexol, Morphine, Iodinated diagnostic agents, Simvastatin, and Codeine   Social History   Socioeconomic History  . Marital status: Married    Spouse name: Not on file  . Number of children: Not on file  . Years of education: Not on file  . Highest education level: Not on file  Occupational History  . Not on file  Social Needs  . Financial resource strain: Not on file  . Food insecurity    Worry: Not on file    Inability: Not on file  .  Transportation needs    Medical: Not on file    Non-medical: Not on file  Tobacco Use  . Smoking status: Never Smoker  . Smokeless tobacco: Never Used  Substance and Sexual Activity  . Alcohol use: No  . Drug use: No  . Sexual activity: Not on file  Lifestyle  . Physical activity    Days per week: Not on file    Minutes per session: Not on file  . Stress: Not on file  Relationships  . Social Herbalist on phone: Not on file    Gets together: Not on file    Attends religious service: Not on file    Active member of club or organization: Not on file    Attends meetings of clubs or organizations: Not on file    Relationship status: Not on file  Other Topics Concern  . Not on file  Social History Narrative  . Not on file     Family History: The patient's family history includes Cancer in her brother; Cerebral aneurysm in her mother; Hypertension in her mother; Pancreatic cancer in her father.  ROS:   Please see the history of present illness.    All other systems reviewed and are negative.  EKGs/Labs/Other Studies Reviewed:    The following studies were reviewed today: Results of testing stress testing was discussed with the patient at length.   Recent Labs: No results found for requested labs within last 8760 hours.  Recent Lipid Panel    Component Value Date/Time   CHOL 121 09/28/2016 0245   TRIG 142 09/28/2016 0245   HDL 26 (L) 09/28/2016 0245   CHOLHDL 4.7 09/28/2016 0245   VLDL 28 09/28/2016 0245   LDLCALC 67 09/28/2016 0245    Physical Exam:    VS:  BP 132/64 (BP Location: Left Arm, Patient Position: Sitting, Cuff Size: Normal)   Pulse 68   Ht 4\' 9"  (1.448 m)   Wt 184 lb 6.4 oz (83.6 kg)   SpO2 96%   BMI 39.90 kg/m     Wt Readings from Last 3 Encounters:  01/28/19 184 lb 6.4 oz (83.6 kg)  03/26/18 193 lb 9.6 oz (87.8 kg)  10/22/16 194 lb 12.8 oz (88.4 kg)     GEN: Patient is in no acute distress HEENT: Normal NECK: No JVD; No  carotid bruits LYMPHATICS: No lymphadenopathy CARDIAC: Hear sounds regular, 2/6 systolic murmur at the apex. RESPIRATORY:  Clear to auscultation without rales, wheezing or rhonchi  ABDOMEN: Soft, non-tender, non-distended MUSCULOSKELETAL:  No edema; No deformity  SKIN: Warm and dry NEUROLOGIC:  Alert and oriented x 3 PSYCHIATRIC:  Normal affect   Signed, Jenean Lindau, MD  01/28/2019 3:20 PM    [facility]

## 2019-02-12 DIAGNOSIS — H25813 Combined forms of age-related cataract, bilateral: Secondary | ICD-10-CM | POA: Diagnosis not present

## 2019-02-12 DIAGNOSIS — E119 Type 2 diabetes mellitus without complications: Secondary | ICD-10-CM | POA: Diagnosis not present

## 2019-02-21 DIAGNOSIS — N281 Cyst of kidney, acquired: Secondary | ICD-10-CM | POA: Diagnosis not present

## 2019-02-21 DIAGNOSIS — R911 Solitary pulmonary nodule: Secondary | ICD-10-CM | POA: Diagnosis not present

## 2019-02-21 DIAGNOSIS — R52 Pain, unspecified: Secondary | ICD-10-CM | POA: Diagnosis not present

## 2019-02-21 DIAGNOSIS — R1084 Generalized abdominal pain: Secondary | ICD-10-CM | POA: Diagnosis not present

## 2019-02-21 DIAGNOSIS — R1032 Left lower quadrant pain: Secondary | ICD-10-CM | POA: Diagnosis not present

## 2019-02-21 DIAGNOSIS — K529 Noninfective gastroenteritis and colitis, unspecified: Secondary | ICD-10-CM | POA: Diagnosis not present

## 2019-02-21 DIAGNOSIS — R110 Nausea: Secondary | ICD-10-CM | POA: Diagnosis not present

## 2019-02-22 DIAGNOSIS — K529 Noninfective gastroenteritis and colitis, unspecified: Secondary | ICD-10-CM | POA: Diagnosis not present

## 2019-02-22 DIAGNOSIS — D638 Anemia in other chronic diseases classified elsewhere: Secondary | ICD-10-CM | POA: Diagnosis not present

## 2019-02-22 DIAGNOSIS — I361 Nonrheumatic tricuspid (valve) insufficiency: Secondary | ICD-10-CM | POA: Diagnosis not present

## 2019-02-22 DIAGNOSIS — Z03818 Encounter for observation for suspected exposure to other biological agents ruled out: Secondary | ICD-10-CM | POA: Diagnosis not present

## 2019-02-22 DIAGNOSIS — Z794 Long term (current) use of insulin: Secondary | ICD-10-CM | POA: Diagnosis not present

## 2019-02-22 DIAGNOSIS — R109 Unspecified abdominal pain: Secondary | ICD-10-CM | POA: Diagnosis not present

## 2019-02-22 DIAGNOSIS — I10 Essential (primary) hypertension: Secondary | ICD-10-CM | POA: Diagnosis not present

## 2019-02-22 DIAGNOSIS — I351 Nonrheumatic aortic (valve) insufficiency: Secondary | ICD-10-CM | POA: Diagnosis not present

## 2019-02-22 DIAGNOSIS — E119 Type 2 diabetes mellitus without complications: Secondary | ICD-10-CM | POA: Diagnosis not present

## 2019-02-22 DIAGNOSIS — R52 Pain, unspecified: Secondary | ICD-10-CM | POA: Diagnosis not present

## 2019-02-22 DIAGNOSIS — Z7982 Long term (current) use of aspirin: Secondary | ICD-10-CM | POA: Diagnosis not present

## 2019-02-22 DIAGNOSIS — E785 Hyperlipidemia, unspecified: Secondary | ICD-10-CM | POA: Diagnosis not present

## 2019-02-22 DIAGNOSIS — R911 Solitary pulmonary nodule: Secondary | ICD-10-CM | POA: Diagnosis not present

## 2019-02-22 DIAGNOSIS — E039 Hypothyroidism, unspecified: Secondary | ICD-10-CM | POA: Diagnosis not present

## 2019-02-22 DIAGNOSIS — R0902 Hypoxemia: Secondary | ICD-10-CM | POA: Diagnosis not present

## 2019-02-22 DIAGNOSIS — N281 Cyst of kidney, acquired: Secondary | ICD-10-CM | POA: Diagnosis not present

## 2019-02-22 DIAGNOSIS — R1084 Generalized abdominal pain: Secondary | ICD-10-CM | POA: Diagnosis not present

## 2019-02-22 DIAGNOSIS — Z91041 Radiographic dye allergy status: Secondary | ICD-10-CM | POA: Diagnosis not present

## 2019-02-22 DIAGNOSIS — Z79891 Long term (current) use of opiate analgesic: Secondary | ICD-10-CM | POA: Diagnosis not present

## 2019-02-22 DIAGNOSIS — R110 Nausea: Secondary | ICD-10-CM | POA: Diagnosis not present

## 2019-02-22 DIAGNOSIS — R918 Other nonspecific abnormal finding of lung field: Secondary | ICD-10-CM | POA: Diagnosis not present

## 2019-02-22 DIAGNOSIS — M1990 Unspecified osteoarthritis, unspecified site: Secondary | ICD-10-CM | POA: Diagnosis not present

## 2019-02-22 DIAGNOSIS — N289 Disorder of kidney and ureter, unspecified: Secondary | ICD-10-CM | POA: Diagnosis not present

## 2019-02-22 DIAGNOSIS — J181 Lobar pneumonia, unspecified organism: Secondary | ICD-10-CM | POA: Diagnosis not present

## 2019-02-22 DIAGNOSIS — Z452 Encounter for adjustment and management of vascular access device: Secondary | ICD-10-CM | POA: Diagnosis not present

## 2019-02-22 DIAGNOSIS — K219 Gastro-esophageal reflux disease without esophagitis: Secondary | ICD-10-CM | POA: Diagnosis not present

## 2019-02-22 DIAGNOSIS — K5730 Diverticulosis of large intestine without perforation or abscess without bleeding: Secondary | ICD-10-CM | POA: Diagnosis not present

## 2019-02-22 DIAGNOSIS — J9601 Acute respiratory failure with hypoxia: Secondary | ICD-10-CM | POA: Diagnosis not present

## 2019-02-22 DIAGNOSIS — Z885 Allergy status to narcotic agent status: Secondary | ICD-10-CM | POA: Diagnosis not present

## 2019-02-22 DIAGNOSIS — R1032 Left lower quadrant pain: Secondary | ICD-10-CM | POA: Diagnosis not present

## 2019-02-22 DIAGNOSIS — J189 Pneumonia, unspecified organism: Secondary | ICD-10-CM | POA: Diagnosis not present

## 2019-02-23 DIAGNOSIS — R109 Unspecified abdominal pain: Secondary | ICD-10-CM | POA: Diagnosis not present

## 2019-02-23 DIAGNOSIS — J189 Pneumonia, unspecified organism: Secondary | ICD-10-CM | POA: Diagnosis not present

## 2019-02-23 DIAGNOSIS — I351 Nonrheumatic aortic (valve) insufficiency: Secondary | ICD-10-CM | POA: Diagnosis not present

## 2019-02-23 DIAGNOSIS — R0902 Hypoxemia: Secondary | ICD-10-CM | POA: Diagnosis not present

## 2019-02-23 DIAGNOSIS — J9601 Acute respiratory failure with hypoxia: Secondary | ICD-10-CM | POA: Diagnosis not present

## 2019-02-23 DIAGNOSIS — I361 Nonrheumatic tricuspid (valve) insufficiency: Secondary | ICD-10-CM | POA: Diagnosis not present

## 2019-02-24 DIAGNOSIS — R109 Unspecified abdominal pain: Secondary | ICD-10-CM | POA: Diagnosis not present

## 2019-02-24 DIAGNOSIS — J9601 Acute respiratory failure with hypoxia: Secondary | ICD-10-CM | POA: Diagnosis not present

## 2019-02-24 DIAGNOSIS — R0902 Hypoxemia: Secondary | ICD-10-CM | POA: Diagnosis not present

## 2019-02-24 DIAGNOSIS — J189 Pneumonia, unspecified organism: Secondary | ICD-10-CM | POA: Diagnosis not present

## 2019-02-25 DIAGNOSIS — J9601 Acute respiratory failure with hypoxia: Secondary | ICD-10-CM | POA: Diagnosis not present

## 2019-02-25 DIAGNOSIS — J189 Pneumonia, unspecified organism: Secondary | ICD-10-CM | POA: Diagnosis not present

## 2019-02-25 DIAGNOSIS — R0902 Hypoxemia: Secondary | ICD-10-CM | POA: Diagnosis not present

## 2019-02-25 DIAGNOSIS — R109 Unspecified abdominal pain: Secondary | ICD-10-CM | POA: Diagnosis not present

## 2019-02-26 DIAGNOSIS — J189 Pneumonia, unspecified organism: Secondary | ICD-10-CM | POA: Diagnosis not present

## 2019-02-26 DIAGNOSIS — R0902 Hypoxemia: Secondary | ICD-10-CM | POA: Diagnosis not present

## 2019-02-26 DIAGNOSIS — R109 Unspecified abdominal pain: Secondary | ICD-10-CM | POA: Diagnosis not present

## 2019-02-26 DIAGNOSIS — J9601 Acute respiratory failure with hypoxia: Secondary | ICD-10-CM | POA: Diagnosis not present

## 2019-02-27 DIAGNOSIS — R1032 Left lower quadrant pain: Secondary | ICD-10-CM | POA: Diagnosis not present

## 2019-02-27 DIAGNOSIS — D649 Anemia, unspecified: Secondary | ICD-10-CM | POA: Diagnosis not present

## 2019-03-23 DIAGNOSIS — D649 Anemia, unspecified: Secondary | ICD-10-CM | POA: Diagnosis not present

## 2019-03-27 DIAGNOSIS — J9601 Acute respiratory failure with hypoxia: Secondary | ICD-10-CM | POA: Diagnosis not present

## 2019-03-27 DIAGNOSIS — R05 Cough: Secondary | ICD-10-CM | POA: Diagnosis not present

## 2019-03-27 DIAGNOSIS — U071 COVID-19: Secondary | ICD-10-CM | POA: Diagnosis not present

## 2019-03-27 DIAGNOSIS — J1289 Other viral pneumonia: Secondary | ICD-10-CM | POA: Diagnosis not present

## 2019-03-27 DIAGNOSIS — R0602 Shortness of breath: Secondary | ICD-10-CM | POA: Diagnosis not present

## 2019-03-27 DIAGNOSIS — J189 Pneumonia, unspecified organism: Secondary | ICD-10-CM | POA: Diagnosis not present

## 2019-03-29 DIAGNOSIS — R6520 Severe sepsis without septic shock: Secondary | ICD-10-CM | POA: Diagnosis not present

## 2019-03-29 DIAGNOSIS — Z7982 Long term (current) use of aspirin: Secondary | ICD-10-CM | POA: Diagnosis not present

## 2019-03-29 DIAGNOSIS — E782 Mixed hyperlipidemia: Secondary | ICD-10-CM | POA: Diagnosis not present

## 2019-03-29 DIAGNOSIS — Z794 Long term (current) use of insulin: Secondary | ICD-10-CM | POA: Diagnosis not present

## 2019-03-29 DIAGNOSIS — E119 Type 2 diabetes mellitus without complications: Secondary | ICD-10-CM | POA: Diagnosis not present

## 2019-03-29 DIAGNOSIS — R918 Other nonspecific abnormal finding of lung field: Secondary | ICD-10-CM | POA: Diagnosis not present

## 2019-03-29 DIAGNOSIS — N179 Acute kidney failure, unspecified: Secondary | ICD-10-CM | POA: Diagnosis not present

## 2019-03-29 DIAGNOSIS — A419 Sepsis, unspecified organism: Secondary | ICD-10-CM | POA: Diagnosis not present

## 2019-03-29 DIAGNOSIS — U071 COVID-19: Secondary | ICD-10-CM

## 2019-03-29 DIAGNOSIS — K219 Gastro-esophageal reflux disease without esophagitis: Secondary | ICD-10-CM | POA: Diagnosis not present

## 2019-03-29 DIAGNOSIS — J1289 Other viral pneumonia: Secondary | ICD-10-CM | POA: Diagnosis not present

## 2019-03-29 DIAGNOSIS — I10 Essential (primary) hypertension: Secondary | ICD-10-CM | POA: Diagnosis not present

## 2019-03-29 DIAGNOSIS — D039 Melanoma in situ, unspecified: Secondary | ICD-10-CM | POA: Diagnosis not present

## 2019-03-29 DIAGNOSIS — E039 Hypothyroidism, unspecified: Secondary | ICD-10-CM | POA: Diagnosis not present

## 2019-03-29 DIAGNOSIS — D649 Anemia, unspecified: Secondary | ICD-10-CM | POA: Diagnosis not present

## 2019-03-29 DIAGNOSIS — R0902 Hypoxemia: Secondary | ICD-10-CM | POA: Diagnosis not present

## 2019-03-29 DIAGNOSIS — J9601 Acute respiratory failure with hypoxia: Secondary | ICD-10-CM | POA: Diagnosis not present

## 2019-03-29 DIAGNOSIS — I4510 Unspecified right bundle-branch block: Secondary | ICD-10-CM | POA: Diagnosis not present

## 2019-03-29 DIAGNOSIS — Z8673 Personal history of transient ischemic attack (TIA), and cerebral infarction without residual deficits: Secondary | ICD-10-CM | POA: Diagnosis not present

## 2019-03-29 HISTORY — DX: COVID-19: U07.1

## 2019-04-13 DIAGNOSIS — U071 COVID-19: Secondary | ICD-10-CM | POA: Diagnosis not present

## 2019-04-13 DIAGNOSIS — J1289 Other viral pneumonia: Secondary | ICD-10-CM | POA: Diagnosis not present

## 2019-04-13 DIAGNOSIS — Z8619 Personal history of other infectious and parasitic diseases: Secondary | ICD-10-CM | POA: Diagnosis not present

## 2019-04-13 DIAGNOSIS — E1169 Type 2 diabetes mellitus with other specified complication: Secondary | ICD-10-CM | POA: Diagnosis not present

## 2019-05-03 DIAGNOSIS — U071 COVID-19: Secondary | ICD-10-CM | POA: Diagnosis not present

## 2019-05-07 DIAGNOSIS — R42 Dizziness and giddiness: Secondary | ICD-10-CM | POA: Diagnosis not present

## 2019-05-07 DIAGNOSIS — S0990XA Unspecified injury of head, initial encounter: Secondary | ICD-10-CM | POA: Diagnosis not present

## 2019-05-07 DIAGNOSIS — S0003XA Contusion of scalp, initial encounter: Secondary | ICD-10-CM | POA: Diagnosis not present

## 2019-05-07 DIAGNOSIS — I10 Essential (primary) hypertension: Secondary | ICD-10-CM | POA: Diagnosis not present

## 2019-05-07 DIAGNOSIS — S199XXA Unspecified injury of neck, initial encounter: Secondary | ICD-10-CM | POA: Diagnosis not present

## 2019-05-07 DIAGNOSIS — W19XXXA Unspecified fall, initial encounter: Secondary | ICD-10-CM | POA: Diagnosis not present

## 2019-05-07 DIAGNOSIS — H748X9 Other specified disorders of middle ear and mastoid, unspecified ear: Secondary | ICD-10-CM | POA: Diagnosis not present

## 2019-05-11 DIAGNOSIS — W19XXXA Unspecified fall, initial encounter: Secondary | ICD-10-CM | POA: Diagnosis not present

## 2019-05-11 DIAGNOSIS — S0003XD Contusion of scalp, subsequent encounter: Secondary | ICD-10-CM | POA: Diagnosis not present

## 2019-05-26 DIAGNOSIS — D649 Anemia, unspecified: Secondary | ICD-10-CM | POA: Diagnosis not present

## 2019-05-26 DIAGNOSIS — D72829 Elevated white blood cell count, unspecified: Secondary | ICD-10-CM | POA: Diagnosis not present

## 2019-05-27 DIAGNOSIS — R918 Other nonspecific abnormal finding of lung field: Secondary | ICD-10-CM | POA: Diagnosis not present

## 2019-07-29 DIAGNOSIS — D649 Anemia, unspecified: Secondary | ICD-10-CM | POA: Diagnosis not present

## 2019-07-29 DIAGNOSIS — E1169 Type 2 diabetes mellitus with other specified complication: Secondary | ICD-10-CM | POA: Diagnosis not present

## 2019-07-29 DIAGNOSIS — M25551 Pain in right hip: Secondary | ICD-10-CM | POA: Diagnosis not present

## 2019-08-03 DIAGNOSIS — E1169 Type 2 diabetes mellitus with other specified complication: Secondary | ICD-10-CM | POA: Diagnosis not present

## 2019-08-05 DIAGNOSIS — D631 Anemia in chronic kidney disease: Secondary | ICD-10-CM | POA: Diagnosis not present

## 2019-08-05 DIAGNOSIS — Z743 Need for continuous supervision: Secondary | ICD-10-CM | POA: Diagnosis not present

## 2019-08-05 DIAGNOSIS — K219 Gastro-esophageal reflux disease without esophagitis: Secondary | ICD-10-CM | POA: Diagnosis not present

## 2019-08-05 DIAGNOSIS — I110 Hypertensive heart disease with heart failure: Secondary | ICD-10-CM | POA: Diagnosis not present

## 2019-08-05 DIAGNOSIS — I509 Heart failure, unspecified: Secondary | ICD-10-CM

## 2019-08-05 DIAGNOSIS — Z91041 Radiographic dye allergy status: Secondary | ICD-10-CM | POA: Diagnosis not present

## 2019-08-05 DIAGNOSIS — M1990 Unspecified osteoarthritis, unspecified site: Secondary | ICD-10-CM | POA: Diagnosis not present

## 2019-08-05 DIAGNOSIS — Z885 Allergy status to narcotic agent status: Secondary | ICD-10-CM | POA: Diagnosis not present

## 2019-08-05 DIAGNOSIS — E785 Hyperlipidemia, unspecified: Secondary | ICD-10-CM | POA: Diagnosis not present

## 2019-08-05 DIAGNOSIS — I351 Nonrheumatic aortic (valve) insufficiency: Secondary | ICD-10-CM | POA: Diagnosis not present

## 2019-08-05 DIAGNOSIS — R0902 Hypoxemia: Secondary | ICD-10-CM | POA: Diagnosis not present

## 2019-08-05 DIAGNOSIS — Z79899 Other long term (current) drug therapy: Secondary | ICD-10-CM | POA: Diagnosis not present

## 2019-08-05 DIAGNOSIS — D649 Anemia, unspecified: Secondary | ICD-10-CM

## 2019-08-05 DIAGNOSIS — I361 Nonrheumatic tricuspid (valve) insufficiency: Secondary | ICD-10-CM | POA: Diagnosis not present

## 2019-08-05 DIAGNOSIS — R0789 Other chest pain: Secondary | ICD-10-CM | POA: Diagnosis not present

## 2019-08-05 DIAGNOSIS — I5031 Acute diastolic (congestive) heart failure: Secondary | ICD-10-CM | POA: Diagnosis not present

## 2019-08-05 DIAGNOSIS — R079 Chest pain, unspecified: Secondary | ICD-10-CM | POA: Diagnosis not present

## 2019-08-05 DIAGNOSIS — J9601 Acute respiratory failure with hypoxia: Secondary | ICD-10-CM

## 2019-08-05 DIAGNOSIS — E1165 Type 2 diabetes mellitus with hyperglycemia: Secondary | ICD-10-CM | POA: Diagnosis not present

## 2019-08-05 DIAGNOSIS — E039 Hypothyroidism, unspecified: Secondary | ICD-10-CM

## 2019-08-05 DIAGNOSIS — R509 Fever, unspecified: Secondary | ICD-10-CM | POA: Diagnosis not present

## 2019-08-05 DIAGNOSIS — D638 Anemia in other chronic diseases classified elsewhere: Secondary | ICD-10-CM | POA: Diagnosis not present

## 2019-08-05 DIAGNOSIS — Z794 Long term (current) use of insulin: Secondary | ICD-10-CM | POA: Diagnosis not present

## 2019-08-05 DIAGNOSIS — R0602 Shortness of breath: Secondary | ICD-10-CM | POA: Diagnosis not present

## 2019-08-05 DIAGNOSIS — Z7982 Long term (current) use of aspirin: Secondary | ICD-10-CM | POA: Diagnosis not present

## 2019-08-05 DIAGNOSIS — E119 Type 2 diabetes mellitus without complications: Secondary | ICD-10-CM

## 2019-08-05 DIAGNOSIS — I5030 Unspecified diastolic (congestive) heart failure: Secondary | ICD-10-CM | POA: Diagnosis not present

## 2019-08-06 DIAGNOSIS — I509 Heart failure, unspecified: Secondary | ICD-10-CM | POA: Diagnosis not present

## 2019-08-06 DIAGNOSIS — I351 Nonrheumatic aortic (valve) insufficiency: Secondary | ICD-10-CM

## 2019-08-06 DIAGNOSIS — J9601 Acute respiratory failure with hypoxia: Secondary | ICD-10-CM | POA: Diagnosis not present

## 2019-08-06 DIAGNOSIS — I361 Nonrheumatic tricuspid (valve) insufficiency: Secondary | ICD-10-CM

## 2019-08-06 DIAGNOSIS — D649 Anemia, unspecified: Secondary | ICD-10-CM | POA: Diagnosis not present

## 2019-08-06 DIAGNOSIS — E119 Type 2 diabetes mellitus without complications: Secondary | ICD-10-CM | POA: Diagnosis not present

## 2019-08-07 DIAGNOSIS — D649 Anemia, unspecified: Secondary | ICD-10-CM | POA: Diagnosis not present

## 2019-08-07 DIAGNOSIS — J9601 Acute respiratory failure with hypoxia: Secondary | ICD-10-CM

## 2019-08-07 DIAGNOSIS — E039 Hypothyroidism, unspecified: Secondary | ICD-10-CM

## 2019-08-07 DIAGNOSIS — I5030 Unspecified diastolic (congestive) heart failure: Secondary | ICD-10-CM

## 2019-08-07 DIAGNOSIS — E119 Type 2 diabetes mellitus without complications: Secondary | ICD-10-CM

## 2019-08-07 DIAGNOSIS — I509 Heart failure, unspecified: Secondary | ICD-10-CM | POA: Diagnosis not present

## 2019-08-14 DIAGNOSIS — K219 Gastro-esophageal reflux disease without esophagitis: Secondary | ICD-10-CM | POA: Diagnosis not present

## 2019-08-14 DIAGNOSIS — I10 Essential (primary) hypertension: Secondary | ICD-10-CM | POA: Diagnosis not present

## 2019-08-14 DIAGNOSIS — E7849 Other hyperlipidemia: Secondary | ICD-10-CM | POA: Diagnosis not present

## 2019-08-17 DIAGNOSIS — Z79899 Other long term (current) drug therapy: Secondary | ICD-10-CM | POA: Diagnosis not present

## 2019-08-17 DIAGNOSIS — E1169 Type 2 diabetes mellitus with other specified complication: Secondary | ICD-10-CM | POA: Diagnosis not present

## 2019-08-17 DIAGNOSIS — I5030 Unspecified diastolic (congestive) heart failure: Secondary | ICD-10-CM | POA: Diagnosis not present

## 2019-08-17 DIAGNOSIS — K219 Gastro-esophageal reflux disease without esophagitis: Secondary | ICD-10-CM | POA: Diagnosis not present

## 2019-08-17 DIAGNOSIS — D649 Anemia, unspecified: Secondary | ICD-10-CM | POA: Diagnosis not present

## 2019-08-18 DIAGNOSIS — D72819 Decreased white blood cell count, unspecified: Secondary | ICD-10-CM | POA: Diagnosis not present

## 2019-08-18 DIAGNOSIS — D649 Anemia, unspecified: Secondary | ICD-10-CM | POA: Diagnosis not present

## 2019-08-26 ENCOUNTER — Ambulatory Visit: Payer: Medicare Other | Admitting: Cardiology

## 2019-08-26 ENCOUNTER — Other Ambulatory Visit: Payer: Self-pay

## 2019-08-26 ENCOUNTER — Encounter: Payer: Self-pay | Admitting: Cardiology

## 2019-08-26 VITALS — BP 136/68 | HR 54 | Ht <= 58 in | Wt 169.0 lb

## 2019-08-26 DIAGNOSIS — I10 Essential (primary) hypertension: Secondary | ICD-10-CM | POA: Diagnosis not present

## 2019-08-26 DIAGNOSIS — I5030 Unspecified diastolic (congestive) heart failure: Secondary | ICD-10-CM | POA: Insufficient documentation

## 2019-08-26 DIAGNOSIS — E782 Mixed hyperlipidemia: Secondary | ICD-10-CM | POA: Diagnosis not present

## 2019-08-26 DIAGNOSIS — E119 Type 2 diabetes mellitus without complications: Secondary | ICD-10-CM

## 2019-08-26 HISTORY — DX: Unspecified diastolic (congestive) heart failure: I50.30

## 2019-08-26 NOTE — Patient Instructions (Signed)

## 2019-08-26 NOTE — Progress Notes (Signed)
Cardiology Office Note:    Date:  08/26/2019   ID:  [name], DOB August 04, 1940, MRN [id number]  PCP:  Mateo Flow, MD  Cardiologist:  Jenean Lindau, MD   Referring MD: Mateo Flow, MD    ASSESSMENT:    1. Essential hypertension   2. Mixed hyperlipidemia   3. Diabetes mellitus without complication ([place])   4. Heart failure with preserved ejection fraction, unspecified HF chronicity ([place])    PLAN:    In order of problems listed above:  1. Primary prevention stressed with the patient.  Importance of compliance with diet medication stressed and she vocalized understanding. 2. Congestive heart failure: I discussed my findings with her at length hospital records were reviewed.  She understands diet and salt intake issues, lifestyle modification.  She weighs herself regularly. 3. Mixed dyslipidemia: Managed by primary care physician.  Diet emphasized 4. Obesity: Patient is doing well with diet and trying to lose weight aggressively.  I can encouraged her to continue this. 5. Essential hypertension: Blood pressure stable and monitored with the patient regularly. 6. Patient will be seen in follow-up appointment in 6 months or earlier if the patient has any concerns    Medication Adjustments/Labs and Tests Ordered: Current medicines are reviewed at length with the patient today.  Concerns regarding medicines are outlined above.  No orders of the defined types were placed in this encounter.  No orders of the defined types were placed in this encounter.    Chief Complaint  Patient presents with  . Follow-up    Hospital FU      History of Present Illness:    [name] is a 79 y.o. female.  Patient was recently admitted to the hospital with congestive heart failure.  I reviewed hospital records extensively.  Patient was treated echocardiogram revealed preserved systolic function so this was diastolic heart failure is a diagnosis.  Patient blood  pressure was optimized and patient is on diuretic therapy.  Patient was discharged home and is doing well keeping a track of her salt intake fluids and weighing herself on a regular basis.  She denies any chest pain orthopnea or PND.  She is ambulating age appropriately.  She is paying attention to her diet and trying to lose weight.  At the time of my evaluation, the patient is alert awake oriented and in no distress.  She recently had blood work by her primary care physician.  She is significantly anemic and is under the follow-up of her hematologist.  Past Medical History:  Diagnosis Date  . Diabetes mellitus without complication ([place])   . GERD (gastroesophageal reflux disease)   . History of cardiac catheterization    a. normal cors by cath in 2007.  Marland Kitchen HLD (hyperlipidemia)   . Hypertension   . Hypothyroidism     Past Surgical History:  Procedure Laterality Date  . ABDOMINAL HYSTERECTOMY    . NECK SURGERY      Current Medications: Current Meds  Medication Sig  . ALPRAZolam (XANAX) 1 MG tablet Take 1 mg by mouth as needed for anxiety.  Marland Kitchen amLODipine (NORVASC) 10 MG tablet Take 1 tablet (10 mg total) by mouth daily.  Marland Kitchen aspirin EC 81 MG tablet Take 81 mg by mouth daily.  Marland Kitchen atorvastatin (LIPITOR) 20 MG tablet Take 20 mg by mouth daily.  . DULoxetine (CYMBALTA) 30 MG capsule Take 30 mg by mouth daily.  . famotidine (PEPCID) 20 MG tablet Take 20 mg  by mouth 2 (two) times daily as needed.  . furosemide (LASIX) 20 MG tablet Take 20 mg by mouth daily.  Marland Kitchen gabapentin (NEURONTIN) 600 MG tablet Take 600-1,200 mg by mouth 2 (two) times daily. Take 600 mg in the morning and 1200 mg at night  . glimepiride (AMARYL) 1 MG tablet Take 4 mg by mouth daily.  . hydrochlorothiazide (HYDRODIURIL) 25 MG tablet Take 1 tablet (25 mg total) by mouth daily.  . Insulin Glargine (LANTUS SOLOSTAR) 100 UNIT/ML Solostar Pen Inject into the skin.  Marland Kitchen JARDIANCE 10 MG TABS tablet   . levothyroxine (SYNTHROID,  LEVOTHROID) 75 MCG tablet Take 75 mcg by mouth daily.  Marland Kitchen loratadine (CLARITIN) 10 MG tablet Take 10 mg by mouth daily.  Marland Kitchen losartan (COZAAR) 100 MG tablet Take 100 mg by mouth daily.  . metFORMIN (GLUCOPHAGE-XR) 750 MG 24 hr tablet Take 1,500 mg by mouth daily.  . metoprolol succinate (TOPROL XL) 25 MG 24 hr tablet Take 0.5 tablets (12.5 mg total) by mouth daily.  . Multiple Vitamin (MULTI-VITAMINS) TABS Take 1 tablet by mouth daily.  . Omega-3 1000 MG CAPS Take by mouth.  . predniSONE (DELTASONE) 5 MG tablet Take 5 mg by mouth daily.  Marland Kitchen ROBAFEN DM CGH/CHEST CONGEST 10-100 MG/5ML liquid daily.  . traMADol (ULTRAM) 50 MG tablet Take by mouth.  . triamcinolone cream (KENALOG) 0.1 % Apply topically.  . TRUEplus Lancets 28G MISC      Allergies:   Iohexol, Morphine, Iodinated diagnostic agents, Simvastatin, and Codeine   Social History   Socioeconomic History  . Marital status: Married    Spouse name: Not on file  . Number of children: Not on file  . Years of education: Not on file  . Highest education level: Not on file  Occupational History  . Not on file  Tobacco Use  . Smoking status: Never Smoker  . Smokeless tobacco: Never Used  Substance and Sexual Activity  . Alcohol use: No  . Drug use: No  . Sexual activity: Not on file  Other Topics Concern  . Not on file  Social History Narrative  . Not on file   Social Determinants of Health   Financial Resource Strain:   . Difficulty of Paying Living Expenses:   Food Insecurity:   . Worried About Charity fundraiser in the Last Year:   . Arboriculturist in the Last Year:   Transportation Needs:   . Film/video editor (Medical):   Marland Kitchen Lack of Transportation (Non-Medical):   Physical Activity:   . Days of Exercise per Week:   . Minutes of Exercise per Session:   Stress:   . Feeling of Stress :   Social Connections:   . Frequency of Communication with Friends and Family:   . Frequency of Social Gatherings with Friends and  Family:   . Attends Religious Services:   . Active Member of Clubs or Organizations:   . Attends Archivist Meetings:   Marland Kitchen Marital Status:      Family History: The patient's family history includes Cancer in her brother; Cerebral aneurysm in her mother; Hypertension in her mother; Pancreatic cancer in her father.  ROS:   Please see the history of present illness.    All other systems reviewed and are negative.  EKGs/Labs/Other Studies Reviewed:    The following studies were reviewed today: I reviewed hospital records extensively   Recent Labs: No results found for requested labs within last 8760 hours.  Recent Lipid Panel    Component Value Date/Time   CHOL 121 09/28/2016 0245   TRIG 142 09/28/2016 0245   HDL 26 (L) 09/28/2016 0245   CHOLHDL 4.7 09/28/2016 0245   VLDL 28 09/28/2016 0245   LDLCALC 67 09/28/2016 0245    Physical Exam:    VS:  BP 136/68   Pulse (!) 54   Ht 4\' 9"  (1.448 m)   Wt 169 lb (76.7 kg)   SpO2 94%   BMI 36.57 kg/m     Wt Readings from Last 3 Encounters:  08/26/19 169 lb (76.7 kg)  01/28/19 184 lb 6.4 oz (83.6 kg)  03/26/18 193 lb 9.6 oz (87.8 kg)     GEN: Patient is in no acute distress HEENT: Normal NECK: No JVD; No carotid bruits LYMPHATICS: No lymphadenopathy CARDIAC: Hear sounds regular, 2/6 systolic murmur at the apex. RESPIRATORY:  Clear to auscultation without rales, wheezing or rhonchi  ABDOMEN: Soft, non-tender, non-distended MUSCULOSKELETAL:  No edema; No deformity  SKIN: Warm and dry NEUROLOGIC:  Alert and oriented x 3 PSYCHIATRIC:  Normal affect   Signed, Jenean Lindau, MD  08/26/2019 5:13 PM    [facility]

## 2019-09-14 DIAGNOSIS — I10 Essential (primary) hypertension: Secondary | ICD-10-CM | POA: Diagnosis not present

## 2019-09-14 DIAGNOSIS — K219 Gastro-esophageal reflux disease without esophagitis: Secondary | ICD-10-CM | POA: Diagnosis not present

## 2019-09-14 DIAGNOSIS — E1169 Type 2 diabetes mellitus with other specified complication: Secondary | ICD-10-CM | POA: Diagnosis not present

## 2019-09-14 DIAGNOSIS — E7800 Pure hypercholesterolemia, unspecified: Secondary | ICD-10-CM | POA: Diagnosis not present

## 2019-10-14 DIAGNOSIS — K219 Gastro-esophageal reflux disease without esophagitis: Secondary | ICD-10-CM | POA: Diagnosis not present

## 2019-10-14 DIAGNOSIS — E7800 Pure hypercholesterolemia, unspecified: Secondary | ICD-10-CM | POA: Diagnosis not present

## 2019-10-14 DIAGNOSIS — E1169 Type 2 diabetes mellitus with other specified complication: Secondary | ICD-10-CM | POA: Diagnosis not present

## 2019-10-14 DIAGNOSIS — I10 Essential (primary) hypertension: Secondary | ICD-10-CM | POA: Diagnosis not present

## 2019-11-02 DIAGNOSIS — D519 Vitamin B12 deficiency anemia, unspecified: Secondary | ICD-10-CM | POA: Diagnosis not present

## 2019-11-02 DIAGNOSIS — E1169 Type 2 diabetes mellitus with other specified complication: Secondary | ICD-10-CM | POA: Diagnosis not present

## 2019-11-14 DIAGNOSIS — I10 Essential (primary) hypertension: Secondary | ICD-10-CM | POA: Diagnosis not present

## 2019-11-14 DIAGNOSIS — E7800 Pure hypercholesterolemia, unspecified: Secondary | ICD-10-CM | POA: Diagnosis not present

## 2019-11-14 DIAGNOSIS — E1169 Type 2 diabetes mellitus with other specified complication: Secondary | ICD-10-CM | POA: Diagnosis not present

## 2019-11-14 DIAGNOSIS — K219 Gastro-esophageal reflux disease without esophagitis: Secondary | ICD-10-CM | POA: Diagnosis not present

## 2019-11-24 DIAGNOSIS — Z0000 Encounter for general adult medical examination without abnormal findings: Secondary | ICD-10-CM | POA: Diagnosis not present

## 2019-11-24 DIAGNOSIS — D649 Anemia, unspecified: Secondary | ICD-10-CM | POA: Diagnosis not present

## 2019-11-24 DIAGNOSIS — E039 Hypothyroidism, unspecified: Secondary | ICD-10-CM | POA: Diagnosis not present

## 2019-11-24 DIAGNOSIS — E1169 Type 2 diabetes mellitus with other specified complication: Secondary | ICD-10-CM | POA: Diagnosis not present

## 2019-12-15 DIAGNOSIS — E1169 Type 2 diabetes mellitus with other specified complication: Secondary | ICD-10-CM | POA: Diagnosis not present

## 2019-12-15 DIAGNOSIS — I10 Essential (primary) hypertension: Secondary | ICD-10-CM | POA: Diagnosis not present

## 2019-12-15 DIAGNOSIS — E7800 Pure hypercholesterolemia, unspecified: Secondary | ICD-10-CM | POA: Diagnosis not present

## 2019-12-15 DIAGNOSIS — K219 Gastro-esophageal reflux disease without esophagitis: Secondary | ICD-10-CM | POA: Diagnosis not present

## 2020-02-13 DIAGNOSIS — K219 Gastro-esophageal reflux disease without esophagitis: Secondary | ICD-10-CM | POA: Diagnosis not present

## 2020-02-13 DIAGNOSIS — I10 Essential (primary) hypertension: Secondary | ICD-10-CM | POA: Diagnosis not present

## 2020-02-13 DIAGNOSIS — E7800 Pure hypercholesterolemia, unspecified: Secondary | ICD-10-CM | POA: Diagnosis not present

## 2020-02-13 DIAGNOSIS — E1169 Type 2 diabetes mellitus with other specified complication: Secondary | ICD-10-CM | POA: Diagnosis not present

## 2020-02-19 DIAGNOSIS — Z23 Encounter for immunization: Secondary | ICD-10-CM | POA: Diagnosis not present

## 2020-03-01 DIAGNOSIS — Z23 Encounter for immunization: Secondary | ICD-10-CM | POA: Diagnosis not present

## 2020-03-04 ENCOUNTER — Ambulatory Visit: Payer: Medicare Other | Admitting: Cardiology

## 2020-03-15 DIAGNOSIS — E7800 Pure hypercholesterolemia, unspecified: Secondary | ICD-10-CM | POA: Diagnosis not present

## 2020-03-15 DIAGNOSIS — I10 Essential (primary) hypertension: Secondary | ICD-10-CM | POA: Diagnosis not present

## 2020-03-15 DIAGNOSIS — E1169 Type 2 diabetes mellitus with other specified complication: Secondary | ICD-10-CM | POA: Diagnosis not present

## 2020-03-15 DIAGNOSIS — K219 Gastro-esophageal reflux disease without esophagitis: Secondary | ICD-10-CM | POA: Diagnosis not present

## 2020-05-25 DIAGNOSIS — E1169 Type 2 diabetes mellitus with other specified complication: Secondary | ICD-10-CM | POA: Diagnosis not present

## 2020-06-13 DIAGNOSIS — E7800 Pure hypercholesterolemia, unspecified: Secondary | ICD-10-CM | POA: Diagnosis not present

## 2020-06-13 DIAGNOSIS — I10 Essential (primary) hypertension: Secondary | ICD-10-CM | POA: Diagnosis not present

## 2020-06-13 DIAGNOSIS — E1169 Type 2 diabetes mellitus with other specified complication: Secondary | ICD-10-CM | POA: Diagnosis not present

## 2020-06-13 DIAGNOSIS — K219 Gastro-esophageal reflux disease without esophagitis: Secondary | ICD-10-CM | POA: Diagnosis not present

## 2020-06-16 DIAGNOSIS — K591 Functional diarrhea: Secondary | ICD-10-CM | POA: Diagnosis not present

## 2020-06-16 DIAGNOSIS — E1169 Type 2 diabetes mellitus with other specified complication: Secondary | ICD-10-CM | POA: Diagnosis not present

## 2020-07-13 DIAGNOSIS — E1169 Type 2 diabetes mellitus with other specified complication: Secondary | ICD-10-CM | POA: Diagnosis not present

## 2020-07-13 DIAGNOSIS — E7800 Pure hypercholesterolemia, unspecified: Secondary | ICD-10-CM | POA: Diagnosis not present

## 2020-07-13 DIAGNOSIS — K219 Gastro-esophageal reflux disease without esophagitis: Secondary | ICD-10-CM | POA: Diagnosis not present

## 2020-07-13 DIAGNOSIS — I10 Essential (primary) hypertension: Secondary | ICD-10-CM | POA: Diagnosis not present

## 2020-07-27 DIAGNOSIS — M542 Cervicalgia: Secondary | ICD-10-CM | POA: Diagnosis not present

## 2020-08-13 DIAGNOSIS — K219 Gastro-esophageal reflux disease without esophagitis: Secondary | ICD-10-CM | POA: Diagnosis not present

## 2020-08-13 DIAGNOSIS — E1169 Type 2 diabetes mellitus with other specified complication: Secondary | ICD-10-CM | POA: Diagnosis not present

## 2020-08-13 DIAGNOSIS — I10 Essential (primary) hypertension: Secondary | ICD-10-CM | POA: Diagnosis not present

## 2020-08-13 DIAGNOSIS — E7800 Pure hypercholesterolemia, unspecified: Secondary | ICD-10-CM | POA: Diagnosis not present

## 2020-08-22 DIAGNOSIS — E1169 Type 2 diabetes mellitus with other specified complication: Secondary | ICD-10-CM | POA: Diagnosis not present

## 2020-09-12 DIAGNOSIS — E7800 Pure hypercholesterolemia, unspecified: Secondary | ICD-10-CM | POA: Diagnosis not present

## 2020-09-12 DIAGNOSIS — E1169 Type 2 diabetes mellitus with other specified complication: Secondary | ICD-10-CM | POA: Diagnosis not present

## 2020-09-12 DIAGNOSIS — K219 Gastro-esophageal reflux disease without esophagitis: Secondary | ICD-10-CM | POA: Diagnosis not present

## 2020-09-12 DIAGNOSIS — I10 Essential (primary) hypertension: Secondary | ICD-10-CM | POA: Diagnosis not present

## 2020-09-27 DIAGNOSIS — R42 Dizziness and giddiness: Secondary | ICD-10-CM | POA: Diagnosis not present

## 2020-09-27 DIAGNOSIS — K591 Functional diarrhea: Secondary | ICD-10-CM | POA: Diagnosis not present

## 2020-09-30 DIAGNOSIS — K591 Functional diarrhea: Secondary | ICD-10-CM | POA: Diagnosis not present

## 2020-09-30 DIAGNOSIS — R109 Unspecified abdominal pain: Secondary | ICD-10-CM | POA: Diagnosis not present

## 2020-10-13 DIAGNOSIS — I10 Essential (primary) hypertension: Secondary | ICD-10-CM | POA: Diagnosis not present

## 2020-10-13 DIAGNOSIS — K219 Gastro-esophageal reflux disease without esophagitis: Secondary | ICD-10-CM | POA: Diagnosis not present

## 2020-10-13 DIAGNOSIS — E7800 Pure hypercholesterolemia, unspecified: Secondary | ICD-10-CM | POA: Diagnosis not present

## 2020-10-13 DIAGNOSIS — E1169 Type 2 diabetes mellitus with other specified complication: Secondary | ICD-10-CM | POA: Diagnosis not present

## 2020-11-13 DIAGNOSIS — K219 Gastro-esophageal reflux disease without esophagitis: Secondary | ICD-10-CM | POA: Diagnosis not present

## 2020-11-13 DIAGNOSIS — I10 Essential (primary) hypertension: Secondary | ICD-10-CM | POA: Diagnosis not present

## 2020-11-13 DIAGNOSIS — E1169 Type 2 diabetes mellitus with other specified complication: Secondary | ICD-10-CM | POA: Diagnosis not present

## 2020-11-13 DIAGNOSIS — E7800 Pure hypercholesterolemia, unspecified: Secondary | ICD-10-CM | POA: Diagnosis not present

## 2020-11-28 DIAGNOSIS — E782 Mixed hyperlipidemia: Secondary | ICD-10-CM | POA: Diagnosis not present

## 2020-11-28 DIAGNOSIS — Z0000 Encounter for general adult medical examination without abnormal findings: Secondary | ICD-10-CM | POA: Diagnosis not present

## 2020-11-28 DIAGNOSIS — E538 Deficiency of other specified B group vitamins: Secondary | ICD-10-CM | POA: Diagnosis not present

## 2020-11-28 DIAGNOSIS — D649 Anemia, unspecified: Secondary | ICD-10-CM | POA: Diagnosis not present

## 2020-11-28 DIAGNOSIS — K591 Functional diarrhea: Secondary | ICD-10-CM | POA: Diagnosis not present

## 2020-11-28 DIAGNOSIS — E1169 Type 2 diabetes mellitus with other specified complication: Secondary | ICD-10-CM | POA: Diagnosis not present

## 2020-11-28 DIAGNOSIS — E039 Hypothyroidism, unspecified: Secondary | ICD-10-CM | POA: Diagnosis not present

## 2020-11-28 DIAGNOSIS — Z79899 Other long term (current) drug therapy: Secondary | ICD-10-CM | POA: Diagnosis not present

## 2020-12-01 DIAGNOSIS — K649 Unspecified hemorrhoids: Secondary | ICD-10-CM | POA: Diagnosis not present

## 2020-12-01 DIAGNOSIS — K219 Gastro-esophageal reflux disease without esophagitis: Secondary | ICD-10-CM | POA: Diagnosis not present

## 2020-12-01 DIAGNOSIS — R194 Change in bowel habit: Secondary | ICD-10-CM | POA: Diagnosis not present

## 2020-12-01 DIAGNOSIS — D649 Anemia, unspecified: Secondary | ICD-10-CM | POA: Diagnosis not present

## 2020-12-01 DIAGNOSIS — E119 Type 2 diabetes mellitus without complications: Secondary | ICD-10-CM | POA: Diagnosis not present

## 2020-12-14 DIAGNOSIS — K219 Gastro-esophageal reflux disease without esophagitis: Secondary | ICD-10-CM | POA: Diagnosis not present

## 2020-12-14 DIAGNOSIS — I10 Essential (primary) hypertension: Secondary | ICD-10-CM | POA: Diagnosis not present

## 2020-12-14 DIAGNOSIS — E1169 Type 2 diabetes mellitus with other specified complication: Secondary | ICD-10-CM | POA: Diagnosis not present

## 2020-12-14 DIAGNOSIS — E7800 Pure hypercholesterolemia, unspecified: Secondary | ICD-10-CM | POA: Diagnosis not present

## 2020-12-23 DIAGNOSIS — I10 Essential (primary) hypertension: Secondary | ICD-10-CM | POA: Diagnosis not present

## 2020-12-23 DIAGNOSIS — K635 Polyp of colon: Secondary | ICD-10-CM | POA: Diagnosis not present

## 2020-12-23 DIAGNOSIS — D122 Benign neoplasm of ascending colon: Secondary | ICD-10-CM | POA: Diagnosis not present

## 2020-12-23 DIAGNOSIS — K644 Residual hemorrhoidal skin tags: Secondary | ICD-10-CM | POA: Diagnosis not present

## 2020-12-23 DIAGNOSIS — Z8673 Personal history of transient ischemic attack (TIA), and cerebral infarction without residual deficits: Secondary | ICD-10-CM | POA: Diagnosis not present

## 2020-12-23 DIAGNOSIS — Z86010 Personal history of colonic polyps: Secondary | ICD-10-CM | POA: Diagnosis not present

## 2020-12-23 DIAGNOSIS — Z7982 Long term (current) use of aspirin: Secondary | ICD-10-CM | POA: Diagnosis not present

## 2020-12-23 DIAGNOSIS — Z794 Long term (current) use of insulin: Secondary | ICD-10-CM | POA: Diagnosis not present

## 2020-12-23 DIAGNOSIS — R194 Change in bowel habit: Secondary | ICD-10-CM | POA: Diagnosis not present

## 2020-12-23 DIAGNOSIS — D649 Anemia, unspecified: Secondary | ICD-10-CM | POA: Diagnosis not present

## 2020-12-23 DIAGNOSIS — E119 Type 2 diabetes mellitus without complications: Secondary | ICD-10-CM | POA: Diagnosis not present

## 2020-12-23 DIAGNOSIS — Z79899 Other long term (current) drug therapy: Secondary | ICD-10-CM | POA: Diagnosis not present

## 2020-12-23 DIAGNOSIS — D126 Benign neoplasm of colon, unspecified: Secondary | ICD-10-CM | POA: Diagnosis not present

## 2020-12-23 DIAGNOSIS — K648 Other hemorrhoids: Secondary | ICD-10-CM | POA: Diagnosis not present

## 2021-01-13 DIAGNOSIS — E7800 Pure hypercholesterolemia, unspecified: Secondary | ICD-10-CM | POA: Diagnosis not present

## 2021-01-13 DIAGNOSIS — I10 Essential (primary) hypertension: Secondary | ICD-10-CM | POA: Diagnosis not present

## 2021-01-25 DIAGNOSIS — Z23 Encounter for immunization: Secondary | ICD-10-CM | POA: Diagnosis not present

## 2021-02-08 DIAGNOSIS — K591 Functional diarrhea: Secondary | ICD-10-CM | POA: Diagnosis not present

## 2021-02-13 DIAGNOSIS — I10 Essential (primary) hypertension: Secondary | ICD-10-CM | POA: Diagnosis not present

## 2021-02-13 DIAGNOSIS — E7800 Pure hypercholesterolemia, unspecified: Secondary | ICD-10-CM | POA: Diagnosis not present

## 2021-02-15 DIAGNOSIS — J069 Acute upper respiratory infection, unspecified: Secondary | ICD-10-CM | POA: Diagnosis not present

## 2021-03-15 DIAGNOSIS — E7800 Pure hypercholesterolemia, unspecified: Secondary | ICD-10-CM | POA: Diagnosis not present

## 2021-03-15 DIAGNOSIS — I10 Essential (primary) hypertension: Secondary | ICD-10-CM | POA: Diagnosis not present

## 2021-04-14 DIAGNOSIS — E1169 Type 2 diabetes mellitus with other specified complication: Secondary | ICD-10-CM | POA: Diagnosis not present

## 2021-04-14 DIAGNOSIS — I5030 Unspecified diastolic (congestive) heart failure: Secondary | ICD-10-CM | POA: Diagnosis not present

## 2021-05-16 DIAGNOSIS — I5030 Unspecified diastolic (congestive) heart failure: Secondary | ICD-10-CM | POA: Diagnosis not present

## 2021-05-16 DIAGNOSIS — E1169 Type 2 diabetes mellitus with other specified complication: Secondary | ICD-10-CM | POA: Diagnosis not present

## 2021-06-12 DIAGNOSIS — E1169 Type 2 diabetes mellitus with other specified complication: Secondary | ICD-10-CM | POA: Diagnosis not present

## 2021-06-12 DIAGNOSIS — E782 Mixed hyperlipidemia: Secondary | ICD-10-CM | POA: Diagnosis not present

## 2021-06-12 DIAGNOSIS — E039 Hypothyroidism, unspecified: Secondary | ICD-10-CM | POA: Diagnosis not present

## 2021-06-13 DIAGNOSIS — I5030 Unspecified diastolic (congestive) heart failure: Secondary | ICD-10-CM | POA: Diagnosis not present

## 2021-06-13 DIAGNOSIS — E1169 Type 2 diabetes mellitus with other specified complication: Secondary | ICD-10-CM | POA: Diagnosis not present

## 2021-06-20 DIAGNOSIS — E782 Mixed hyperlipidemia: Secondary | ICD-10-CM | POA: Diagnosis not present

## 2021-06-20 DIAGNOSIS — D649 Anemia, unspecified: Secondary | ICD-10-CM | POA: Diagnosis not present

## 2021-06-20 DIAGNOSIS — E039 Hypothyroidism, unspecified: Secondary | ICD-10-CM | POA: Diagnosis not present

## 2021-06-20 DIAGNOSIS — I10 Essential (primary) hypertension: Secondary | ICD-10-CM | POA: Diagnosis not present

## 2021-06-20 DIAGNOSIS — E1169 Type 2 diabetes mellitus with other specified complication: Secondary | ICD-10-CM | POA: Diagnosis not present

## 2021-06-26 DIAGNOSIS — M25551 Pain in right hip: Secondary | ICD-10-CM | POA: Diagnosis not present

## 2021-07-19 DIAGNOSIS — M25551 Pain in right hip: Secondary | ICD-10-CM | POA: Diagnosis not present

## 2021-10-13 DIAGNOSIS — I10 Essential (primary) hypertension: Secondary | ICD-10-CM | POA: Diagnosis not present

## 2021-10-13 DIAGNOSIS — E7800 Pure hypercholesterolemia, unspecified: Secondary | ICD-10-CM | POA: Diagnosis not present

## 2021-11-21 DIAGNOSIS — M25551 Pain in right hip: Secondary | ICD-10-CM | POA: Diagnosis not present

## 2021-12-19 DIAGNOSIS — E119 Type 2 diabetes mellitus without complications: Secondary | ICD-10-CM | POA: Diagnosis not present

## 2022-02-09 DIAGNOSIS — Z23 Encounter for immunization: Secondary | ICD-10-CM | POA: Diagnosis not present

## 2022-02-27 DIAGNOSIS — M25561 Pain in right knee: Secondary | ICD-10-CM | POA: Diagnosis not present

## 2022-03-22 DIAGNOSIS — M25561 Pain in right knee: Secondary | ICD-10-CM | POA: Diagnosis not present

## 2022-03-29 DIAGNOSIS — J0100 Acute maxillary sinusitis, unspecified: Secondary | ICD-10-CM | POA: Diagnosis not present

## 2022-03-29 DIAGNOSIS — R0981 Nasal congestion: Secondary | ICD-10-CM | POA: Diagnosis not present

## 2022-04-17 DIAGNOSIS — H2512 Age-related nuclear cataract, left eye: Secondary | ICD-10-CM | POA: Diagnosis not present

## 2022-04-17 DIAGNOSIS — H35373 Puckering of macula, bilateral: Secondary | ICD-10-CM | POA: Diagnosis not present

## 2022-04-17 DIAGNOSIS — H2513 Age-related nuclear cataract, bilateral: Secondary | ICD-10-CM | POA: Diagnosis not present

## 2022-04-17 DIAGNOSIS — E119 Type 2 diabetes mellitus without complications: Secondary | ICD-10-CM | POA: Diagnosis not present

## 2022-04-17 DIAGNOSIS — Z01818 Encounter for other preprocedural examination: Secondary | ICD-10-CM | POA: Diagnosis not present

## 2022-04-24 DIAGNOSIS — H2512 Age-related nuclear cataract, left eye: Secondary | ICD-10-CM | POA: Diagnosis not present

## 2022-04-24 DIAGNOSIS — E1136 Type 2 diabetes mellitus with diabetic cataract: Secondary | ICD-10-CM | POA: Diagnosis not present

## 2022-04-24 DIAGNOSIS — I10 Essential (primary) hypertension: Secondary | ICD-10-CM | POA: Diagnosis not present

## 2022-04-24 DIAGNOSIS — H259 Unspecified age-related cataract: Secondary | ICD-10-CM | POA: Diagnosis not present

## 2022-04-24 DIAGNOSIS — H353131 Nonexudative age-related macular degeneration, bilateral, early dry stage: Secondary | ICD-10-CM | POA: Diagnosis not present

## 2022-04-24 DIAGNOSIS — H35373 Puckering of macula, bilateral: Secondary | ICD-10-CM | POA: Diagnosis not present

## 2022-04-24 DIAGNOSIS — H25812 Combined forms of age-related cataract, left eye: Secondary | ICD-10-CM | POA: Diagnosis not present

## 2022-05-23 DIAGNOSIS — H2512 Age-related nuclear cataract, left eye: Secondary | ICD-10-CM | POA: Diagnosis not present

## 2022-05-29 DIAGNOSIS — I10 Essential (primary) hypertension: Secondary | ICD-10-CM | POA: Diagnosis not present

## 2022-05-29 DIAGNOSIS — I509 Heart failure, unspecified: Secondary | ICD-10-CM | POA: Diagnosis not present

## 2022-05-29 DIAGNOSIS — Z794 Long term (current) use of insulin: Secondary | ICD-10-CM | POA: Diagnosis not present

## 2022-05-29 DIAGNOSIS — H259 Unspecified age-related cataract: Secondary | ICD-10-CM | POA: Diagnosis not present

## 2022-05-29 DIAGNOSIS — H2511 Age-related nuclear cataract, right eye: Secondary | ICD-10-CM | POA: Diagnosis not present

## 2022-05-29 DIAGNOSIS — H25811 Combined forms of age-related cataract, right eye: Secondary | ICD-10-CM | POA: Diagnosis not present

## 2022-05-29 DIAGNOSIS — E119 Type 2 diabetes mellitus without complications: Secondary | ICD-10-CM | POA: Diagnosis not present

## 2022-05-29 DIAGNOSIS — E7800 Pure hypercholesterolemia, unspecified: Secondary | ICD-10-CM | POA: Diagnosis not present

## 2022-05-29 DIAGNOSIS — H35373 Puckering of macula, bilateral: Secondary | ICD-10-CM | POA: Diagnosis not present

## 2022-05-29 DIAGNOSIS — I110 Hypertensive heart disease with heart failure: Secondary | ICD-10-CM | POA: Diagnosis not present

## 2022-05-29 DIAGNOSIS — Z7982 Long term (current) use of aspirin: Secondary | ICD-10-CM | POA: Diagnosis not present

## 2022-05-29 DIAGNOSIS — H353131 Nonexudative age-related macular degeneration, bilateral, early dry stage: Secondary | ICD-10-CM | POA: Diagnosis not present

## 2022-05-29 DIAGNOSIS — Z79899 Other long term (current) drug therapy: Secondary | ICD-10-CM | POA: Diagnosis not present

## 2022-06-07 DIAGNOSIS — Z9181 History of falling: Secondary | ICD-10-CM | POA: Diagnosis not present

## 2022-06-07 DIAGNOSIS — Z0000 Encounter for general adult medical examination without abnormal findings: Secondary | ICD-10-CM | POA: Diagnosis not present

## 2022-06-07 DIAGNOSIS — E039 Hypothyroidism, unspecified: Secondary | ICD-10-CM | POA: Diagnosis not present

## 2022-06-07 DIAGNOSIS — D638 Anemia in other chronic diseases classified elsewhere: Secondary | ICD-10-CM | POA: Diagnosis not present

## 2022-06-07 DIAGNOSIS — B351 Tinea unguium: Secondary | ICD-10-CM | POA: Diagnosis not present

## 2022-06-07 DIAGNOSIS — E1169 Type 2 diabetes mellitus with other specified complication: Secondary | ICD-10-CM | POA: Diagnosis not present

## 2022-11-15 DIAGNOSIS — H00023 Hordeolum internum right eye, unspecified eyelid: Secondary | ICD-10-CM | POA: Diagnosis not present

## 2022-11-30 DIAGNOSIS — L820 Inflamed seborrheic keratosis: Secondary | ICD-10-CM | POA: Diagnosis not present

## 2023-01-17 DIAGNOSIS — M25551 Pain in right hip: Secondary | ICD-10-CM | POA: Diagnosis not present

## 2023-01-17 DIAGNOSIS — M47816 Spondylosis without myelopathy or radiculopathy, lumbar region: Secondary | ICD-10-CM | POA: Diagnosis not present

## 2023-02-15 DIAGNOSIS — E113393 Type 2 diabetes mellitus with moderate nonproliferative diabetic retinopathy without macular edema, bilateral: Secondary | ICD-10-CM | POA: Diagnosis not present

## 2023-02-22 DIAGNOSIS — Z23 Encounter for immunization: Secondary | ICD-10-CM | POA: Diagnosis not present

## 2023-03-01 DIAGNOSIS — E1169 Type 2 diabetes mellitus with other specified complication: Secondary | ICD-10-CM | POA: Diagnosis not present

## 2023-03-01 DIAGNOSIS — Z23 Encounter for immunization: Secondary | ICD-10-CM | POA: Diagnosis not present

## 2023-03-01 DIAGNOSIS — D649 Anemia, unspecified: Secondary | ICD-10-CM | POA: Diagnosis not present

## 2023-03-24 DIAGNOSIS — R069 Unspecified abnormalities of breathing: Secondary | ICD-10-CM | POA: Diagnosis not present

## 2023-03-24 DIAGNOSIS — I352 Nonrheumatic aortic (valve) stenosis with insufficiency: Secondary | ICD-10-CM | POA: Diagnosis not present

## 2023-03-24 DIAGNOSIS — D509 Iron deficiency anemia, unspecified: Secondary | ICD-10-CM | POA: Diagnosis not present

## 2023-03-24 DIAGNOSIS — E1142 Type 2 diabetes mellitus with diabetic polyneuropathy: Secondary | ICD-10-CM | POA: Diagnosis not present

## 2023-03-24 DIAGNOSIS — E039 Hypothyroidism, unspecified: Secondary | ICD-10-CM | POA: Diagnosis not present

## 2023-03-24 DIAGNOSIS — R609 Edema, unspecified: Secondary | ICD-10-CM | POA: Diagnosis not present

## 2023-03-24 DIAGNOSIS — Z7982 Long term (current) use of aspirin: Secondary | ICD-10-CM | POA: Diagnosis not present

## 2023-03-24 DIAGNOSIS — I5031 Acute diastolic (congestive) heart failure: Secondary | ICD-10-CM | POA: Diagnosis not present

## 2023-03-24 DIAGNOSIS — Z885 Allergy status to narcotic agent status: Secondary | ICD-10-CM | POA: Diagnosis not present

## 2023-03-24 DIAGNOSIS — E1122 Type 2 diabetes mellitus with diabetic chronic kidney disease: Secondary | ICD-10-CM | POA: Diagnosis not present

## 2023-03-24 DIAGNOSIS — R0600 Dyspnea, unspecified: Secondary | ICD-10-CM | POA: Diagnosis not present

## 2023-03-24 DIAGNOSIS — I5033 Acute on chronic diastolic (congestive) heart failure: Secondary | ICD-10-CM | POA: Diagnosis not present

## 2023-03-24 DIAGNOSIS — Z794 Long term (current) use of insulin: Secondary | ICD-10-CM | POA: Diagnosis not present

## 2023-03-24 DIAGNOSIS — J9811 Atelectasis: Secondary | ICD-10-CM | POA: Diagnosis not present

## 2023-03-24 DIAGNOSIS — R918 Other nonspecific abnormal finding of lung field: Secondary | ICD-10-CM | POA: Diagnosis not present

## 2023-03-24 DIAGNOSIS — E7800 Pure hypercholesterolemia, unspecified: Secondary | ICD-10-CM | POA: Diagnosis not present

## 2023-03-24 DIAGNOSIS — D638 Anemia in other chronic diseases classified elsewhere: Secondary | ICD-10-CM | POA: Diagnosis not present

## 2023-03-24 DIAGNOSIS — N1832 Chronic kidney disease, stage 3b: Secondary | ICD-10-CM | POA: Diagnosis not present

## 2023-03-24 DIAGNOSIS — J90 Pleural effusion, not elsewhere classified: Secondary | ICD-10-CM | POA: Diagnosis not present

## 2023-03-24 DIAGNOSIS — Z91041 Radiographic dye allergy status: Secondary | ICD-10-CM | POA: Diagnosis not present

## 2023-03-24 DIAGNOSIS — Z79899 Other long term (current) drug therapy: Secondary | ICD-10-CM | POA: Diagnosis not present

## 2023-03-24 DIAGNOSIS — Z7984 Long term (current) use of oral hypoglycemic drugs: Secondary | ICD-10-CM | POA: Diagnosis not present

## 2023-03-24 DIAGNOSIS — Z1152 Encounter for screening for COVID-19: Secondary | ICD-10-CM | POA: Diagnosis not present

## 2023-03-24 DIAGNOSIS — M1990 Unspecified osteoarthritis, unspecified site: Secondary | ICD-10-CM | POA: Diagnosis not present

## 2023-03-24 DIAGNOSIS — I4510 Unspecified right bundle-branch block: Secondary | ICD-10-CM | POA: Diagnosis not present

## 2023-03-24 DIAGNOSIS — K219 Gastro-esophageal reflux disease without esophagitis: Secondary | ICD-10-CM | POA: Diagnosis not present

## 2023-03-24 DIAGNOSIS — J811 Chronic pulmonary edema: Secondary | ICD-10-CM | POA: Diagnosis not present

## 2023-03-24 DIAGNOSIS — Z886 Allergy status to analgesic agent status: Secondary | ICD-10-CM | POA: Diagnosis not present

## 2023-03-24 DIAGNOSIS — J9601 Acute respiratory failure with hypoxia: Secondary | ICD-10-CM | POA: Diagnosis not present

## 2023-03-24 DIAGNOSIS — I130 Hypertensive heart and chronic kidney disease with heart failure and stage 1 through stage 4 chronic kidney disease, or unspecified chronic kidney disease: Secondary | ICD-10-CM | POA: Diagnosis not present

## 2023-03-24 DIAGNOSIS — R911 Solitary pulmonary nodule: Secondary | ICD-10-CM | POA: Diagnosis not present

## 2023-03-24 DIAGNOSIS — R9431 Abnormal electrocardiogram [ECG] [EKG]: Secondary | ICD-10-CM | POA: Diagnosis not present

## 2023-04-05 DIAGNOSIS — J22 Unspecified acute lower respiratory infection: Secondary | ICD-10-CM | POA: Diagnosis not present

## 2023-04-05 DIAGNOSIS — I5030 Unspecified diastolic (congestive) heart failure: Secondary | ICD-10-CM | POA: Diagnosis not present

## 2023-04-05 DIAGNOSIS — D638 Anemia in other chronic diseases classified elsewhere: Secondary | ICD-10-CM | POA: Diagnosis not present

## 2023-04-07 DIAGNOSIS — E039 Hypothyroidism, unspecified: Secondary | ICD-10-CM | POA: Diagnosis not present

## 2023-04-07 DIAGNOSIS — I469 Cardiac arrest, cause unspecified: Secondary | ICD-10-CM | POA: Diagnosis not present

## 2023-04-07 DIAGNOSIS — E7800 Pure hypercholesterolemia, unspecified: Secondary | ICD-10-CM | POA: Diagnosis not present

## 2023-04-07 DIAGNOSIS — I509 Heart failure, unspecified: Secondary | ICD-10-CM | POA: Diagnosis not present

## 2023-04-07 DIAGNOSIS — R55 Syncope and collapse: Secondary | ICD-10-CM | POA: Diagnosis not present

## 2023-04-07 DIAGNOSIS — I4510 Unspecified right bundle-branch block: Secondary | ICD-10-CM | POA: Diagnosis not present

## 2023-04-07 DIAGNOSIS — K219 Gastro-esophageal reflux disease without esophagitis: Secondary | ICD-10-CM | POA: Diagnosis not present

## 2023-04-07 DIAGNOSIS — N179 Acute kidney failure, unspecified: Secondary | ICD-10-CM | POA: Diagnosis not present

## 2023-04-07 DIAGNOSIS — R079 Chest pain, unspecified: Secondary | ICD-10-CM | POA: Diagnosis not present

## 2023-04-07 DIAGNOSIS — I350 Nonrheumatic aortic (valve) stenosis: Secondary | ICD-10-CM | POA: Diagnosis not present

## 2023-04-07 DIAGNOSIS — I499 Cardiac arrhythmia, unspecified: Secondary | ICD-10-CM | POA: Diagnosis not present

## 2023-04-07 DIAGNOSIS — Z794 Long term (current) use of insulin: Secondary | ICD-10-CM | POA: Diagnosis not present

## 2023-04-07 DIAGNOSIS — R6889 Other general symptoms and signs: Secondary | ICD-10-CM | POA: Diagnosis not present

## 2023-04-07 DIAGNOSIS — I10 Essential (primary) hypertension: Secondary | ICD-10-CM | POA: Diagnosis not present

## 2023-04-07 DIAGNOSIS — R001 Bradycardia, unspecified: Secondary | ICD-10-CM | POA: Diagnosis not present

## 2023-04-07 DIAGNOSIS — E119 Type 2 diabetes mellitus without complications: Secondary | ICD-10-CM | POA: Diagnosis not present

## 2023-04-07 DIAGNOSIS — R9431 Abnormal electrocardiogram [ECG] [EKG]: Secondary | ICD-10-CM | POA: Diagnosis not present

## 2023-04-07 DIAGNOSIS — I2699 Other pulmonary embolism without acute cor pulmonale: Secondary | ICD-10-CM | POA: Diagnosis not present

## 2023-04-07 DIAGNOSIS — Z79899 Other long term (current) drug therapy: Secondary | ICD-10-CM | POA: Diagnosis not present

## 2023-04-07 DIAGNOSIS — I110 Hypertensive heart disease with heart failure: Secondary | ICD-10-CM | POA: Diagnosis not present

## 2023-04-07 DIAGNOSIS — R404 Transient alteration of awareness: Secondary | ICD-10-CM | POA: Diagnosis not present

## 2023-04-07 DIAGNOSIS — Z743 Need for continuous supervision: Secondary | ICD-10-CM | POA: Diagnosis not present

## 2023-04-08 DIAGNOSIS — R55 Syncope and collapse: Secondary | ICD-10-CM | POA: Diagnosis not present

## 2023-04-08 DIAGNOSIS — N179 Acute kidney failure, unspecified: Secondary | ICD-10-CM | POA: Diagnosis not present

## 2023-04-12 DIAGNOSIS — I5030 Unspecified diastolic (congestive) heart failure: Secondary | ICD-10-CM | POA: Diagnosis not present

## 2023-04-12 DIAGNOSIS — H9191 Unspecified hearing loss, right ear: Secondary | ICD-10-CM | POA: Diagnosis not present

## 2023-04-22 ENCOUNTER — Ambulatory Visit: Payer: Medicare Other | Admitting: Cardiology

## 2023-04-24 ENCOUNTER — Encounter: Payer: Self-pay | Admitting: Cardiology

## 2023-04-25 ENCOUNTER — Ambulatory Visit: Payer: No Typology Code available for payment source

## 2023-04-25 ENCOUNTER — Encounter: Payer: Self-pay | Admitting: Cardiology

## 2023-04-25 ENCOUNTER — Ambulatory Visit: Payer: No Typology Code available for payment source | Attending: Cardiology | Admitting: Cardiology

## 2023-04-25 VITALS — BP 142/58 | HR 60 | Ht 60.0 in | Wt 159.0 lb

## 2023-04-25 DIAGNOSIS — E119 Type 2 diabetes mellitus without complications: Secondary | ICD-10-CM

## 2023-04-25 DIAGNOSIS — R002 Palpitations: Secondary | ICD-10-CM

## 2023-04-25 DIAGNOSIS — K219 Gastro-esophageal reflux disease without esophagitis: Secondary | ICD-10-CM

## 2023-04-25 DIAGNOSIS — R0609 Other forms of dyspnea: Secondary | ICD-10-CM

## 2023-04-25 DIAGNOSIS — I10 Essential (primary) hypertension: Secondary | ICD-10-CM

## 2023-04-25 DIAGNOSIS — I5032 Chronic diastolic (congestive) heart failure: Secondary | ICD-10-CM | POA: Diagnosis not present

## 2023-04-25 DIAGNOSIS — E782 Mixed hyperlipidemia: Secondary | ICD-10-CM

## 2023-04-25 NOTE — Progress Notes (Signed)
 Cardiology Consultation:    Date:  04/25/2023   ID:  [name], DOB [birth year], MRN [id number]  PCP:  Fernand Tracey LABOR, MD  Cardiologist:  Lamar Fitch, MD   Referring MD: Fernand Tracey LABOR, MD   Chief Complaint  Patient presents with   Congestive Heart Failure    History of Present Illness:    [name] is a 83 y.o. female who is being seen today for the evaluation of congestive heart failure at the request of Fernand Tracey LABOR, MD. past medical history significant for diastolic congestive heart failure, GERD, diabetes, hypertension, dyslipidemia, hypothyroidism.  In December she ended up being in the hospital twice.  First admission was secondary to shortness of breath she was found to be in pulmonary edema, diagnosis of diastolic congestive heart failure recurrent was confirmed and she was treated aggressively with diuretic discharged home with better condition however end up being back in the hospital few days later because of episode of syncope.  It was felt to be related to hypotension related to dehydration.  She was also noted to be somewhat bradycardic beta-blocker has been lowered and she was discharged home.  Comes today to months for follow-up.  She is doing well.  She denies have any chest pain tightness squeezing pressure burning chest no dizziness passing out.  She denies having any swelling of lower extremities.  Past Medical History:  Diagnosis Date   Diabetes mellitus without complication (HCC)    GERD (gastroesophageal reflux disease)    History of cardiac catheterization    a. normal cors by cath in 2007.   HLD (hyperlipidemia)    Hypertension    Hypothyroidism     Past Surgical History:  Procedure Laterality Date   ABDOMINAL HYSTERECTOMY     NECK SURGERY      Current Medications: Current Meds  Medication Sig   atorvastatin  (LIPITOR) 20 MG tablet Take 20 mg by mouth daily.   colesevelam (WELCHOL) 625 MG tablet Take 625 mg by mouth  2 (two) times daily with a meal.   cyanocobalamin  (VITAMIN B12) 1000 MCG tablet Take 1,000 mcg by mouth daily.   DULoxetine (CYMBALTA) 30 MG capsule Take 30 mg by mouth daily.   empagliflozin (JARDIANCE) 25 MG TABS tablet Take 25 mg by mouth daily.   furosemide  (LASIX ) 20 MG tablet Take 20 mg by mouth daily.   gabapentin  (NEURONTIN ) 600 MG tablet Take 600 mg by mouth 3 (three) times daily.   Insulin  Glargine (LANTUS SOLOSTAR) 100 UNIT/ML Solostar Pen Inject 20 Units into the skin daily.   levothyroxine  (SYNTHROID , LEVOTHROID) 75 MCG tablet Take 75 mcg by mouth daily.   loratadine  (CLARITIN ) 10 MG tablet Take 10 mg by mouth daily.   losartan  (COZAAR ) 100 MG tablet Take 100 mg by mouth daily.   metoprolol  succinate (TOPROL  XL) 25 MG 24 hr tablet Take 0.5 tablets (12.5 mg total) by mouth daily. (Patient taking differently: Take 6.25 mg by mouth daily.)   Multiple Vitamin (MULTI-VITAMINS) TABS Take 1 tablet by mouth daily.   Omega-3 1000 MG CAPS Take 1 capsule by mouth daily.   pantoprazole  (PROTONIX ) 40 MG tablet Take 40 mg by mouth daily.   TRUEplus Lancets 28G MISC 1 each.   [DISCONTINUED] ALPRAZolam  (XANAX ) 1 MG tablet Take 1 mg by mouth as needed for anxiety.   [DISCONTINUED] amLODipine  (NORVASC ) 10 MG tablet Take 1 tablet (10 mg total) by mouth daily.   [DISCONTINUED] aspirin  EC 81 MG tablet Take  81 mg by mouth daily.   [DISCONTINUED] famotidine (PEPCID) 20 MG tablet Take 20 mg by mouth 2 (two) times daily as needed.   [DISCONTINUED] glimepiride (AMARYL) 1 MG tablet Take 4 mg by mouth daily.   [DISCONTINUED] hydrochlorothiazide  (HYDRODIURIL ) 25 MG tablet Take 1 tablet (25 mg total) by mouth daily.   [DISCONTINUED] metFORMIN (GLUCOPHAGE-XR) 750 MG 24 hr tablet Take 1,500 mg by mouth daily.   [DISCONTINUED] predniSONE (DELTASONE) 5 MG tablet Take 5 mg by mouth daily.   [DISCONTINUED] ROBAFEN DM CGH/CHEST CONGEST 10-100 MG/5ML liquid Take 5 mLs by mouth daily.   [DISCONTINUED] traMADol  (ULTRAM) 50 MG tablet Take 50 mg by mouth every 6 (six) hours as needed for moderate pain (pain score 4-6) or severe pain (pain score 7-10).   [DISCONTINUED] triamcinolone  cream (KENALOG ) 0.1 % Apply 1 Application topically 2 (two) times daily.     Allergies:   Iohexol, Morphine, Iodinated contrast media, Simvastatin, and Codeine   Social History   Socioeconomic History   Marital status: Married    Spouse name: Not on file   Number of children: Not on file   Years of education: Not on file   Highest education level: Not on file  Occupational History   Not on file  Tobacco Use   Smoking status: Never   Smokeless tobacco: Never  Substance and Sexual Activity   Alcohol use: No   Drug use: No   Sexual activity: Not on file  Other Topics Concern   Not on file  Social History Narrative   Not on file   Social Drivers of Health   Financial Resource Strain: Not on file  Food Insecurity: Not on file  Transportation Needs: Not on file  Physical Activity: Not on file  Stress: Not on file  Social Connections: Not on file     Family History: The patient's family history includes Cancer in her brother; Cerebral aneurysm in her mother; Hypertension in her mother; Pancreatic cancer in her father. ROS:   Please see the history of present illness.    All 14 point review of systems negative except as described per history of present illness.  EKGs/Labs/Other Studies Reviewed:    The following studies were reviewed today: Echocardiogram showed preserved left ventricular ejection fraction, impaired laxation, mild aortic stenosis mild aortic regurgitation  EKG:  EKG Interpretation Date/Time:  Thursday April 25 2023 14:06:39 EST Ventricular Rate:  61 PR Interval:  168 QRS Duration:  92 QT Interval:  414 QTC Calculation: 416 R Axis:   104  Text Interpretation: Normal sinus rhythm Possible Right ventricular hypertrophy Abnormal ECG When compared with ECG of 28-Sep-2016 04:32, No  significant change was found Confirmed by Bernie Charleston [phone]) on 04/25/2023 2:41:01 PM    Recent Labs: No results found for requested labs within last 365 days.  Recent Lipid Panel    Component Value Date/Time   CHOL 121 09/28/2016 0245   TRIG 142 09/28/2016 0245   HDL 26 (L) 09/28/2016 0245   CHOLHDL 4.7 09/28/2016 0245   VLDL 28 09/28/2016 0245   LDLCALC 67 09/28/2016 0245    Physical Exam:    VS:  BP (!) 142/58 (BP Location: Right Arm, Patient Position: Sitting)   Pulse 60   Ht 5' (1.524 m)   Wt 159 lb (72.1 kg)   SpO2 90%   BMI 31.05 kg/m     Wt Readings from Last 3 Encounters:  04/25/23 159 lb (72.1 kg)  08/26/19 169 lb (76.7 kg)  01/28/19 184 lb 6.4 oz (83.6 kg)     GEN:  Well nourished, well developed in no acute distress HEENT: Normal NECK: No JVD; No carotid bruits LYMPHATICS: No lymphadenopathy CARDIAC: RRR, soft systolic ejection murmur 1/6 best heard right upper portion of the sternum, no rubs, no gallops RESPIRATORY:  Clear to auscultation without rales, wheezing or rhonchi  ABDOMEN: Soft, non-tender, non-distended MUSCULOSKELETAL:  No edema; No deformity  SKIN: Warm and dry NEUROLOGIC:  Alert and oriented x 3 PSYCHIATRIC:  Normal affect   ASSESSMENT:    1. Essential hypertension   2. Chronic heart failure with preserved ejection fraction (HCC)   3. Gastroesophageal reflux disease without esophagitis   4. Diabetes mellitus without complication (H[facility])   5. Morbid obesity (H[facility])   6. Mixed hyperlipidemia    PLAN:    In order of problems listed above:  Chronic diastolic congestive heart failure.  Seems to be compensated on physical exam today.  I instructed them about checking weight every single day they were already educated about salt restriction.  Will check proBNP as well as Chem-7 today to have a baseline. Essential hypertension blood pressure slightly elevated today but this is first visit to my office will continue monitoring  it. Bradycardia will ask her to wear Zio patch for 2 weeks to see if she get any significant bradycardia if that is the case beta-blocker will be withdrawn. Dyslipidemia I did review K PN which show me data from 2023 with total cholesterol 139 HDL 36.  Will check fasting lipid profile   Medication Adjustments/Labs and Tests Ordered: Current medicines are reviewed at length with the patient today.  Concerns regarding medicines are outlined above.  Orders Placed This Encounter  Procedures   EKG 12-Lead   No orders of the defined types were placed in this encounter.   Signed, Lamar DOROTHA Fitch, MD, [facility]. 04/25/2023 3:04 PM    [facility]

## 2023-04-25 NOTE — Patient Instructions (Signed)
 Medication Instructions:  Your physician recommends that you continue on your current medications as directed. Please refer to the Current Medication list given to you today.  *If you need a refill on your cardiac medications before your next appointment, please call your pharmacy*   Lab Work: CMP, CBC, ProBNP- today If you have labs (blood work) drawn today and your tests are completely normal, you will receive your results only by: MyChart Message (if you have MyChart) OR A paper copy in the mail If you have any lab test that is abnormal or we need to change your treatment, we will call you to review the results.   Testing/Procedures:  WHY IS MY DOCTOR PRESCRIBING ZIO? The Zio system is proven and trusted by physicians to detect and diagnose irregular heart rhythms -- and has been prescribed to hundreds of thousands of patients.  The FDA has cleared the Zio system to monitor for many different kinds of irregular heart rhythms. In a study, physicians were able to reach a diagnosis 90% of the time with the Zio system1.  You can wear the Zio monitor -- a small, discreet, comfortable patch -- during your normal day-to-day activity, including while you sleep, shower, and exercise, while it records every single heartbeat for analysis.  1Barrett, P., et al. Comparison of 24 Hour Holter Monitoring Versus 14 Day Novel Adhesive Patch Electrocardiographic Monitoring. American Journal of Medicine, 2014.  ZIO VS. HOLTER MONITORING The Zio monitor can be comfortably worn for up to 14 days. Holter monitors can be worn for 24 to 48 hours, limiting the time to record any irregular heart rhythms you may have. Zio is able to capture data for the 51% of patients who have their first symptom-triggered arrhythmia after 48 hours.1  LIVE WITHOUT RESTRICTIONS The Zio ambulatory cardiac monitor is a small, unobtrusive, and water-resistant patch--you might even forget you're wearing it. The Zio monitor records  and stores every beat of your heart, whether you're sleeping, working out, or showering.     Follow-Up: At [facility], you and your health needs are our priority.  As part of our continuing mission to provide you with exceptional heart care, we have created designated Provider Care Teams.  These Care Teams include your primary Cardiologist (physician) and Advanced Practice Providers (APPs -  Physician Assistants and Nurse Practitioners) who all work together to provide you with the care you need, when you need it.  We recommend signing up for the patient portal called MyChart.  Sign up information is provided on this After Visit Summary.  MyChart is used to connect with patients for Virtual Visits (Telemedicine).  Patients are able to view lab/test results, encounter notes, upcoming appointments, etc.  Non-urgent messages can be sent to your provider as well.   To learn more about what you can do with MyChart, go to [email or web address].    Your next appointment:   3 month(s)  The format for your next appointment:   In Person  Provider:   Lamar Fitch, MD    Other Instructions NA

## 2023-04-26 ENCOUNTER — Telehealth: Payer: Self-pay

## 2023-04-26 ENCOUNTER — Other Ambulatory Visit: Payer: Self-pay

## 2023-04-26 DIAGNOSIS — I5033 Acute on chronic diastolic (congestive) heart failure: Secondary | ICD-10-CM

## 2023-04-26 LAB — COMPREHENSIVE METABOLIC PANEL
ALT: 8 [IU]/L (ref 0–32)
AST: 13 [IU]/L (ref 0–40)
Albumin: 4.2 g/dL (ref 3.7–4.7)
Alkaline Phosphatase: 92 [IU]/L (ref 44–121)
BUN/Creatinine Ratio: 22 (ref 12–28)
BUN: 26 mg/dL (ref 8–27)
Bilirubin Total: 0.7 mg/dL (ref 0.0–1.2)
CO2: 25 mmol/L (ref 20–29)
Calcium: 9.5 mg/dL (ref 8.7–10.3)
Chloride: 103 mmol/L (ref 96–106)
Creatinine, Ser: 1.2 mg/dL — ABNORMAL HIGH (ref 0.57–1.00)
Globulin, Total: 2.5 g/dL (ref 1.5–4.5)
Glucose: 101 mg/dL — ABNORMAL HIGH (ref 70–99)
Potassium: 6 mmol/L (ref 3.5–5.2)
Sodium: 141 mmol/L (ref 134–144)
Total Protein: 6.7 g/dL (ref 6.0–8.5)
eGFR: 45 mL/min/{1.73_m2} — ABNORMAL LOW (ref >59)

## 2023-04-26 LAB — PRO B NATRIURETIC PEPTIDE: NT-Pro BNP: 373 pg/mL (ref 0–738)

## 2023-04-26 LAB — CBC
Hematocrit: 29.3 % — ABNORMAL LOW (ref 34.0–46.6)
Hemoglobin: 9.7 g/dL — ABNORMAL LOW (ref 11.1–15.9)
MCH: 31.3 pg (ref 26.6–33.0)
MCHC: 33.1 g/dL (ref 31.5–35.7)
MCV: 95 fL (ref 79–97)
Platelets: 420 10*3/uL (ref 150–450)
RBC: 3.1 x10E6/uL — ABNORMAL LOW (ref 3.77–5.28)
RDW: 15.6 % — ABNORMAL HIGH (ref 11.7–15.4)
WBC: 5.8 10*3/uL (ref 3.4–10.8)

## 2023-04-26 NOTE — Telephone Encounter (Signed)
 Spoke with daughter this morning regarding high potassium reading called by [facility] to on call PA overnight. Dr. Krasowski recommended that she go tot the ED for potassium check and treatment. Daughter agreed to take pt to [facility] ED for potassium check and treatment this morning. Recheck in 3 months at follow up appt.

## 2023-04-26 NOTE — Telephone Encounter (Signed)
 Patients daughter called and reported that the patient went to the ER and was given Kayexalate and they told her to hold her potassium over the weekend. She was to come back to the office to have a lab draw on Monday to recheck her potasium.

## 2023-04-26 NOTE — Telephone Encounter (Signed)
 Pt daughter called in stating pt went to ED and her potassium was elevated, they told her to not take any of her meds this weekend and to come back in the office Monday and have them redone. Please advise if she can have this order placed.

## 2023-04-29 ENCOUNTER — Telehealth: Payer: Self-pay | Admitting: Pharmacist

## 2023-04-29 NOTE — Progress Notes (Signed)
 04/29/2023  [name] Jul 30, 1940 [id number]   Reason for call: 2025 Medication Assistance Reenrollment  Lantus and Jardiance   Outreach:  Unsuccessful telephone call attempt #1 to patient.   Unable to leave message  Plan:  -I will make another outreach attempt to patient in 7 to 10 business days.   Annabella Galla, PharmD Clinical Pharmacist [place] Direct Dial: [phone]

## 2023-04-30 LAB — BASIC METABOLIC PANEL
BUN/Creatinine Ratio: 20 (ref 12–28)
BUN: 24 mg/dL (ref 8–27)
CO2: 22 mmol/L (ref 20–29)
Calcium: 8.9 mg/dL (ref 8.7–10.3)
Chloride: 104 mmol/L (ref 96–106)
Creatinine, Ser: 1.2 mg/dL — ABNORMAL HIGH (ref 0.57–1.00)
Glucose: 137 mg/dL — ABNORMAL HIGH (ref 70–99)
Potassium: 5.7 mmol/L — ABNORMAL HIGH (ref 3.5–5.2)
Sodium: 142 mmol/L (ref 134–144)
eGFR: 45 mL/min/{1.73_m2} — ABNORMAL LOW (ref >59)

## 2023-05-01 ENCOUNTER — Telehealth: Payer: Self-pay | Admitting: Cardiology

## 2023-05-01 DIAGNOSIS — I10 Essential (primary) hypertension: Secondary | ICD-10-CM

## 2023-05-01 NOTE — Telephone Encounter (Signed)
 Spoke with pt. Advised per Dr. Krasowski to continue off Potassium and decrease Cozaar  to 50mg  from 100mg  and have BMP checked in 1 week.

## 2023-05-01 NOTE — Telephone Encounter (Signed)
 Pt calling in requesting cb regarding lab results so she will know what to do about her potassium

## 2023-05-01 NOTE — Telephone Encounter (Signed)
 Called patient and she was asking about her lab results from this past Monday (04/29/23). She is trying to decide what to do regarding her potasium?

## 2023-05-11 LAB — BASIC METABOLIC PANEL
BUN/Creatinine Ratio: 22 (ref 12–28)
BUN: 26 mg/dL (ref 8–27)
CO2: 22 mmol/L (ref 20–29)
Calcium: 9.3 mg/dL (ref 8.7–10.3)
Chloride: 104 mmol/L (ref 96–106)
Creatinine, Ser: 1.19 mg/dL — ABNORMAL HIGH (ref 0.57–1.00)
Glucose: 143 mg/dL — ABNORMAL HIGH (ref 70–99)
Potassium: 5.5 mmol/L — ABNORMAL HIGH (ref 3.5–5.2)
Sodium: 141 mmol/L (ref 134–144)
eGFR: 46 mL/min/{1.73_m2} — ABNORMAL LOW (ref >59)

## 2023-05-16 ENCOUNTER — Telehealth: Payer: Self-pay

## 2023-05-16 ENCOUNTER — Telehealth: Payer: Self-pay | Admitting: Cardiology

## 2023-05-16 DIAGNOSIS — I10 Essential (primary) hypertension: Secondary | ICD-10-CM

## 2023-05-16 NOTE — Telephone Encounter (Signed)
Pt is on Losartan 50mg . Advised per Dr. Vanetta Shawl note to decrease to 25mg  daily. Pt stated that she will quarter the 100mg  tablet and let us know if it becomes a problem. She will come in for a BMP in a week. Routed to PCP.

## 2023-05-16 NOTE — Telephone Encounter (Signed)
Pt is returning nurse call regarding results. Please advise

## 2023-05-23 ENCOUNTER — Telehealth: Payer: Self-pay | Admitting: Emergency Medicine

## 2023-05-23 NOTE — Telephone Encounter (Signed)
 Results reviewed with pt as per Dr. Vanetta Shawl note.  Pt verbalized understanding and had no additional questions. Routed to PCP

## 2023-05-23 NOTE — Telephone Encounter (Signed)
-----   Message from Ralene Burger sent at 05/23/2023 10:41 AM EST ----- Supraventricular tachycardia noted, however asymptomatic, no need to treat

## 2023-05-25 LAB — BASIC METABOLIC PANEL
BUN/Creatinine Ratio: 16 (ref 12–28)
BUN: 17 mg/dL (ref 8–27)
CO2: 27 mmol/L (ref 20–29)
Calcium: 8.7 mg/dL (ref 8.7–10.3)
Chloride: 106 mmol/L (ref 96–106)
Creatinine, Ser: 1.08 mg/dL — ABNORMAL HIGH (ref 0.57–1.00)
Glucose: 143 mg/dL — ABNORMAL HIGH (ref 70–99)
Potassium: 4.8 mmol/L (ref 3.5–5.2)
Sodium: 146 mmol/L — ABNORMAL HIGH (ref 134–144)
eGFR: 52 mL/min/{1.73_m2} — ABNORMAL LOW (ref >59)

## 2023-05-29 ENCOUNTER — Telehealth: Payer: Self-pay

## 2023-05-29 NOTE — Telephone Encounter (Signed)
Lab Results reviewed with pt as per Dr. Vanetta Shawl note.  Pt verbalized understanding and had no additional questions. Routed to PCP

## 2023-06-05 ENCOUNTER — Other Ambulatory Visit: Payer: Self-pay | Admitting: Pharmacist

## 2023-06-05 NOTE — Progress Notes (Signed)
06/05/2023 Name: [name] MRN: [id number] DOB: 03-05-1941  Chief Complaint  Patient presents with   Medication Management   Medication Access    [name] is a 83 y.o. year old female who presented for a telephone visit.   They were referred to the pharmacist by their PCP for assistance in managing medication access.    Subjective:  Care Team: Primary Care Provider: Lise Auer, MD ; Next Scheduled Visit: None  Medication Access/Adherence  Current Pharmacy:  [facility] - [place], [place] - [address] [facility] AT Portal to Registered [facility] [address] [place] [address] Phone: [phone] Fax: [phone]   Patient reports affordability concerns with their medications: Yes  Patient reports access/transportation concerns to their pharmacy: No  Patient reports adherence concerns with their medications:  Yes  Part D deductible    Objective:  Lab Results  Component Value Date   CREATININE 1.08 (H) 05/24/2023   BUN 17 05/24/2023   NA 146 (H) 05/24/2023   K 4.8 05/24/2023   CL 106 05/24/2023   CO2 27 05/24/2023    Lab Results  Component Value Date   CHOL 121 09/28/2016   HDL 26 (L) 09/28/2016   LDLCALC 67 09/28/2016   TRIG 142 09/28/2016   CHOLHDL 4.7 09/28/2016    Medications Reviewed Today     Reviewed by Marisa Hua, RPH (Pharmacist) on 06/05/23 at 1549  Med List Status: <None>   Medication Order Taking? Sig Documenting Provider Last Dose Status Informant  amLODipine (NORVASC) 10 MG tablet [id number] Yes Take 5 mg by mouth daily. [provider] Taking Active   atorvastatin (LIPITOR) 20 MG tablet [id number] Yes Take 20 mg by mouth daily. [provider] Taking Active   cyanocobalamin (VITAMIN B12) 1000 MCG tablet [id number] Yes Take 1,000 mcg by mouth daily. [provider] Taking Active   DULoxetine (CYMBALTA) 30 MG capsule [id number] Yes  Take 30 mg by mouth daily. [provider] Taking Active   empagliflozin (JARDIANCE) 25 MG TABS tablet [id number] Yes Take 25 mg by mouth daily. [provider] Taking Active   furosemide (LASIX) 20 MG tablet [id number] Yes Take 20 mg by mouth daily. [provider] Taking Active   gabapentin (NEURONTIN) 600 MG tablet [id number] Yes Take 600 mg by mouth 3 (three) times daily. [provider] Taking Active Self  Insulin Glargine (LANTUS SOLOSTAR) 100 UNIT/ML Solostar Pen [id number] Yes Inject 20 Units into the skin daily. [provider] Taking Active   levothyroxine (SYNTHROID, LEVOTHROID) 75 MCG tablet [id number] Yes Take 75 mcg by mouth daily. [provider] Taking Active Self  loratadine (CLARITIN) 10 MG tablet [id number] Yes Take 10 mg by mouth daily. [provider] Taking Active Self  losartan (COZAAR) 100 MG tablet [id number] Yes Take 50 mg by mouth daily. [provider] Taking Active Self           Med Note Frazier Butt, Talan Gildner N   Wed Jun 05, 2023  3:36 PM) 25 mg daily   metoprolol succinate (TOPROL XL) 25 MG 24 hr tablet [id number] Yes Take 0.5 tablets (12.5 mg total) by mouth daily. Lennette Bihari, MD Taking Active   Multiple Vitamin (MULTI-VITAMINS) TABS [id number] Yes Take 1 tablet by mouth daily. [provider] Taking Active Self  Omega-3 1000 MG CAPS [id number] No Take 1 capsule by mouth daily.  Patient not taking: Reported on 06/05/2023   [provider] Not Taking  Consider Medication Status and Discontinue (Completed Course)   pantoprazole (PROTONIX) 40 MG tablet [id number] Yes Take 40 mg by mouth daily. [provider] Taking Active Self  TRUEplus Lancets 28G MISC [id number] Yes 1 each. [provider] Taking Active              Assessment/Plan:   Engaged with [name] via telephone today. [name] has changed to [facility] [place] this year, she has a $590 deductible  for T3 and higher medications.  Reviewed patient assistance program with [employer] for Jardiance. Based on patient's reported household income, patient may qualify for Medicare Extra Help Program. Completed the application via telephone with patient today. Advised patient that it may take up to 4 weeks to receive determination from [employer]  Reviewed medications with patient in detail with patient today. Patient will need her One Touch Ultra test strips sent to her new mail order pharmacy,   -Will have One Touch Ultra test strips sent to mail order.  -Will inquire with [facility] on Jardiance samples for patient.  -Will send application to [facility] for Angus for patient to sign. -Will follow up with [employer] regarding enrollment status for Lantus.  - Will contact Cardiology for aspirin clarification.   Reynold Bowen, PharmD Clinical Pharmacist Direct Dial: [phone]

## 2023-06-07 ENCOUNTER — Other Ambulatory Visit: Payer: Self-pay | Admitting: Pharmacist

## 2023-06-07 NOTE — Progress Notes (Signed)
Care Coordination Call  Follow up telephone call to [name] for the following:  -One Touch Ultra test strips have been sent to mail order. Patient confirms the mail order pharmacy called her today and the strips will be covered at $0.   -Application faxed to [facility] for [name] for patient to sign and request for samples has been made. [name] plans to go by the office this afternoon.  -Followed up with [employer] regarding enrollment status for Lantus, per representative, the application is under review. No timeline could be given for a determination. Will continue to follow up.  - Will contact Cardiology for aspirin clarification.  -[name] also inquires if she is to continue her iron supplement. Will clarify with PCP.   Reynold Bowen, PharmD Clinical Pharmacist [place] Direct Dial: [phone]

## 2023-06-13 ENCOUNTER — Telehealth: Payer: Self-pay | Admitting: Pharmacy Technician

## 2023-06-13 ENCOUNTER — Other Ambulatory Visit: Payer: Self-pay | Admitting: Pharmacist

## 2023-06-13 DIAGNOSIS — Z5986 Financial insecurity: Secondary | ICD-10-CM

## 2023-06-13 NOTE — Progress Notes (Signed)
 Pharmacy Medication Assistance Program Note    06/13/2023  Patient ID: Isla Sabree, female   DOB: 02-28-1941, 83 y.o.   MRN: [id number]     06/13/2023  Outreach Medication One  Initial Outreach Date (Medication One) 06/07/2023  Manufacturer Medication One [employer]  [employer] Drugs Jardiance  Dose of Jardiance 25mg   Type of Radiographer, therapeutic Assistance  Date Application Sent to Patient 06/07/2023  Application Items Requested Application;Proof of Income;Other  Date Application Sent to Prescriber 06/07/2023  Name of Prescriber Jaber Welton Flakes  Date Application Received From Patient 06/10/2023  Application Items Received From Patient Application;Proof of Income;Other  Date Application Received From Provider 06/10/2023  Date Application Submitted to Manufacturer 06/12/2023  Method Application Sent to Manufacturer Fax     Pattricia Boss, CPhT [place]  Office: [phone] Fax: [phone] Email: [email or web address]

## 2023-06-13 NOTE — Progress Notes (Signed)
 Care Coordination Call  Telephonic outreach to Mrs. Steinfeldt today re: Lantus patient assistance for 2025. Made patient aware that application is still under review as of Monday per [employer]. Patient confirms that she has Lantus on hand. She will notify me if she is running low. Will follow up with patient when we receive outcome of Lantus patient assistance for 2025.   Reynold Bowen, PharmD Clinical Pharmacist [place] Direct Dial: [phone]

## 2023-06-17 ENCOUNTER — Telehealth: Payer: Self-pay | Admitting: Pharmacy Technician

## 2023-06-17 DIAGNOSIS — Z5986 Financial insecurity: Secondary | ICD-10-CM

## 2023-06-17 NOTE — Progress Notes (Signed)
 Pharmacy Medication Assistance Program Note    06/17/2023  Patient ID: [name], female   DOB: 13-Jun-1940, 83 y.o.   MRN: [id number]     06/13/2023 06/17/2023  Outreach Medication One  Initial Outreach Date (Medication One) 06/07/2023   Manufacturer Medication One [employer]   [employer] Drugs Jardiance   Dose of Jardiance 25mg    Type of Radiographer, therapeutic Assistance   Date Application Sent to Patient 06/07/2023   Application Items Requested Application;Proof of Income;Other   Date Application Sent to Prescriber 06/07/2023   Name of Prescriber Jaber Welton Flakes   Date Application Received From Patient 06/10/2023   Application Items Received From Patient Application;Proof of Income;Other   Date Application Received From Provider 06/10/2023   Date Application Submitted to Manufacturer 06/12/2023   Method Application Sent to Manufacturer Fax   Patient Assistance Determination  Approved  Approval Start Date  06/15/2023  Approval End Date  04/15/2024  Patient Notification Method  Telephone Call  Telephone Call Outcome  Successful    Care coordination call placed to [employer]  in regard to Jardiance application. Spoke to [place] who informs patient is APPROVED 06/15/23-04/15/24. She informs initial shipment will process automatically and be delivered to the patient's home. Subsequent refills will require patient to call [employer] at [phone] to reorder the medication. Patient can also request to be placed on automatic refills at that time.  Successful outreach to patient. HIPAA verified. Informed patient of  her approval, refill procedure and when to expect first shipment in 2025. Patient verbalized understanding. Will route in basket note to [facility] Pharmacist notifying her of this information as well.  Pattricia Boss, CPhT [place]  Office: [phone] Fax: [phone] Email: [email or web address]

## 2023-06-26 ENCOUNTER — Telehealth: Payer: Self-pay | Admitting: Pharmacist

## 2023-06-26 NOTE — Progress Notes (Signed)
   06/26/2023  Patient ID: Gwenyth Bender, female   DOB: 09-Aug-1940, 83 y.o.   MRN: [id number]  Follow up telephone call to [facility] patient assistance company regarding Lantus applications submitted December 26/2024. Previous representatives have advised that application was under review. Today representative advised that the missing information submitted in December was not reprocessed. Will be reprocessed today. Will await fax from program.   Reynold Bowen, PharmD Clinical Pharmacist [place] Direct Dial: [phone]

## 2023-07-02 ENCOUNTER — Telehealth: Payer: Self-pay | Admitting: Pharmacist

## 2023-07-02 NOTE — Progress Notes (Signed)
 Attempted to contact patient for medication access re: LIS follow up and [employer]/Lantus application status. No answer and no voicemail.    Reynold Bowen, PharmD Clinical Pharmacist [place] Direct Dial: [phone]

## 2023-07-09 ENCOUNTER — Telehealth: Payer: Self-pay

## 2023-07-09 ENCOUNTER — Other Ambulatory Visit (HOSPITAL_COMMUNITY): Payer: Self-pay

## 2023-07-09 ENCOUNTER — Telehealth: Payer: Self-pay | Admitting: Pharmacist

## 2023-07-09 NOTE — Telephone Encounter (Signed)
 Pharmacy Patient Advocate Encounter  Insurance verification completed.   The patient is insured through [employer] test claim for Lantus. Currently a quantity of 18 is a 90 day supply and the co-pay is $12.16 . The current 90 day co-pay is, $12.16.  No PA needed at this time.  This test claim was processed through [facility]- copay amounts may vary at other pharmacies due to pharmacy/plan contracts, or as the patient moves through the different stages of their insurance plan.

## 2023-07-09 NOTE — Progress Notes (Signed)
   07/09/2023  Patient ID: Gwenyth Bender, female   DOB: 05/25/1940, 83 y.o.   MRN: [id number]  Telephonic outreach to Mrs. Lappe today re: Lantus patient assistance. Per test claim, 90 ds of Lantus will be $12.15. Patient approved for LIS. Inquired if patient still would like assistance with additional needs. Patient declines referral to Social Work at this time. Patient has changed her insurance to [facility] and will be making appointment soon to have all medications changed to [facility]. Patient will contact me for any needs in the future.   Reynold Bowen, PharmD Clinical Pharmacist [place] Direct Dial: [phone]

## 2023-07-10 ENCOUNTER — Telehealth: Payer: Self-pay | Admitting: Pharmacist

## 2023-07-10 NOTE — Progress Notes (Signed)
   07/10/2023  Patient ID: [name], female   DOB: 10-21-1940, 83 y.o.   MRN: [id number]  Received fax from [facility] patient is approved to receive Lantus until 04/15/2024. Placed call to patient to inform. Office will call patient once medication arrives.    Reynold Bowen, PharmD Clinical Pharmacist [place] Direct Dial: [phone]

## 2023-07-25 DIAGNOSIS — J9601 Acute respiratory failure with hypoxia: Secondary | ICD-10-CM | POA: Diagnosis not present

## 2023-07-25 DIAGNOSIS — I5031 Acute diastolic (congestive) heart failure: Secondary | ICD-10-CM | POA: Diagnosis not present

## 2023-08-09 ENCOUNTER — Encounter: Payer: Self-pay | Admitting: Cardiology

## 2023-08-09 ENCOUNTER — Ambulatory Visit: Attending: Cardiology | Admitting: Cardiology

## 2023-08-09 VITALS — BP 150/52 | HR 53 | Ht 60.0 in | Wt 161.2 lb

## 2023-08-09 DIAGNOSIS — R0609 Other forms of dyspnea: Secondary | ICD-10-CM

## 2023-08-09 DIAGNOSIS — I10 Essential (primary) hypertension: Secondary | ICD-10-CM

## 2023-08-09 DIAGNOSIS — I5032 Chronic diastolic (congestive) heart failure: Secondary | ICD-10-CM

## 2023-08-09 DIAGNOSIS — I5030 Unspecified diastolic (congestive) heart failure: Secondary | ICD-10-CM | POA: Diagnosis not present

## 2023-08-09 DIAGNOSIS — D638 Anemia in other chronic diseases classified elsewhere: Secondary | ICD-10-CM | POA: Diagnosis not present

## 2023-08-09 DIAGNOSIS — E119 Type 2 diabetes mellitus without complications: Secondary | ICD-10-CM

## 2023-08-09 MED ORDER — LOSARTAN POTASSIUM 100 MG PO TABS: 50.0000 mg | ORAL_TABLET | Freq: Every day | ORAL | 3 refills | Status: DC

## 2023-08-09 MED ORDER — LOSARTAN POTASSIUM 50 MG PO TABS: 50.0000 mg | ORAL_TABLET | Freq: Every day | ORAL | 3 refills | Status: AC

## 2023-08-09 NOTE — Patient Instructions (Addendum)
 Medication Instructions:   INCREASE: Losartan  to 50mg  daily   Lab Work: Your physician recommends that you return for lab work in: 1 week You need to have labs done when you are fasting.  You can come Monday through Friday 8:30 am to 12:00 pm and 1:15 to 4:30. You do not need to make an appointment as the order has already been placed. The labs you are going to have done are BMET.    Testing/Procedures: None Ordered   Follow-Up: At [facility], you and your health needs are our priority.  As part of our continuing mission to provide you with exceptional heart care, we have created designated Provider Care Teams.  These Care Teams include your primary Cardiologist (physician) and Advanced Practice Providers (APPs -  Physician Assistants and Nurse Practitioners) who all work together to provide you with the care you need, when you need it.  We recommend signing up for the patient portal called "MyChart".  Sign up information is provided on this After Visit Summary.  MyChart is used to connect with patients for Virtual Visits (Telemedicine).  Patients are able to view lab/test results, encounter notes, upcoming appointments, etc.  Non-urgent messages can be sent to your provider as well.   To learn more about what you can do with MyChart, go to [email or web address].    Your next appointment:   6 month(s)  The format for your next appointment:   In Person  Provider:   Ralene Burger, MD    Other Instructions NA

## 2023-08-09 NOTE — Addendum Note (Signed)
 Addended by: Shawnee Dellen D on: 08/09/2023 08:56 AM   Modules accepted: Orders

## 2023-08-09 NOTE — Progress Notes (Signed)
 Cardiology Office Note:    Date:  08/09/2023   ID:  [name], DOB November 25, 1940, MRN [id number]  PCP:  Beecher Bower, MD  Cardiologist:  Ralene Burger, MD    Referring MD: Beecher Bower, MD   Chief Complaint  Patient presents with   Medication Management    History of Present Illness:    [name] is a 83 y.o. female past medical history significant for diastolic congestive heart failure, GERD, diabetes, hypertension, dyslipidemia, hypothyroidism in December she ended up going to the hospital twice in the first admission was because of shortness of breath she was found to be in pulmonary edema diastolic congestive heart failure hide being diagnosed she was diuresed and doing great since that time couple days later she ended up in the hospital because of episodes of syncope she was found to be hypotensive and dehydrated somewhat bradycardic discharge home since that time she is doing well.  Denies have any chest pain tightness squeezing pressure burning chest overall she is very happy the way she feels  Past Medical History:  Diagnosis Date   Diabetes mellitus without complication (HCC)    GERD (gastroesophageal reflux disease)    History of cardiac catheterization    a. normal cors by cath in 2007.   HLD (hyperlipidemia)    Hypertension    Hypothyroidism     Past Surgical History:  Procedure Laterality Date   ABDOMINAL HYSTERECTOMY     NECK SURGERY      Current Medications: Current Meds  Medication Sig   amLODipine  (NORVASC ) 10 MG tablet Take 5 mg by mouth daily.   atorvastatin  (LIPITOR) 20 MG tablet Take 20 mg by mouth daily.   cyanocobalamin  (VITAMIN B12) 1000 MCG tablet Take 1,000 mcg by mouth daily.   DULoxetine (CYMBALTA) 30 MG capsule Take 30 mg by mouth daily.   empagliflozin (JARDIANCE) 25 MG TABS tablet Take 25 mg by mouth daily.   furosemide  (LASIX ) 20 MG tablet Take 20 mg by mouth daily.   gabapentin  (NEURONTIN ) 600 MG tablet  Take 600 mg by mouth 3 (three) times daily.   Insulin  Glargine (LANTUS SOLOSTAR) 100 UNIT/ML Solostar Pen Inject 20 Units into the skin daily.   levofloxacin (LEVAQUIN) 500 MG tablet Take 500 mg by mouth daily.   levothyroxine  (SYNTHROID , LEVOTHROID) 75 MCG tablet Take 75 mcg by mouth daily.   loratadine  (CLARITIN ) 10 MG tablet Take 10 mg by mouth daily.   losartan  (COZAAR ) 100 MG tablet Take 50 mg by mouth daily.   metoprolol  succinate (TOPROL  XL) 25 MG 24 hr tablet Take 0.5 tablets (12.5 mg total) by mouth daily.   Multiple Vitamin (MULTI-VITAMINS) TABS Take 1 tablet by mouth daily.   Omega-3 1000 MG CAPS Take 1 capsule by mouth daily.   pantoprazole  (PROTONIX ) 40 MG tablet Take 40 mg by mouth daily.   potassium chloride  (KLOR-CON  M) 10 MEQ tablet Take 10 mEq by mouth daily.   triamcinolone  cream (KENALOG ) 0.1 % Apply 1 Application topically 2 (two) times daily.   TRUEplus Lancets 28G MISC 1 each by Other route daily.     Allergies:   Iohexol, Morphine, Iodinated contrast media, Simvastatin, and Codeine   Social History   Socioeconomic History   Marital status: Married    Spouse name: Not on file   Number of children: Not on file   Years of education: Not on file   Highest education level: Not on file  Occupational History   Not  on file  Tobacco Use   Smoking status: Never   Smokeless tobacco: Never  Substance and Sexual Activity   Alcohol use: No   Drug use: No   Sexual activity: Not on file  Other Topics Concern   Not on file  Social History Narrative   Not on file   Social Drivers of Health   Financial Resource Strain: Not on file  Food Insecurity: Not on file  Transportation Needs: Not on file  Physical Activity: Not on file  Stress: Not on file  Social Connections: Not on file     Family History: The patient's family history includes Cancer in her brother; Cerebral aneurysm in her mother; Hypertension in her mother; Pancreatic cancer in her father. ROS:    Please see the history of present illness.    All 14 point review of systems negative except as described per history of present illness  EKGs/Labs/Other Studies Reviewed:         Recent Labs: 04/25/2023: ALT 8; Hemoglobin 9.7; NT-Pro BNP 373; Platelets 420 05/24/2023: BUN 17; Creatinine, Ser 1.08; Potassium 4.8; Sodium 146  Recent Lipid Panel    Component Value Date/Time   CHOL 121 09/28/2016 0245   TRIG 142 09/28/2016 0245   HDL 26 (L) 09/28/2016 0245   CHOLHDL 4.7 09/28/2016 0245   VLDL 28 09/28/2016 0245   LDLCALC 67 09/28/2016 0245    Physical Exam:    VS:  BP (!) 150/52 (BP Location: Right Arm, Patient Position: Sitting)   Pulse (!) 53   Ht 5' (1.524 m)   Wt 161 lb 3.2 oz (73.1 kg)   SpO2 90%   BMI 31.48 kg/m     Wt Readings from Last 3 Encounters:  08/09/23 161 lb 3.2 oz (73.1 kg)  04/25/23 159 lb (72.1 kg)  08/26/19 169 lb (76.7 kg)     GEN:  Well nourished, well developed in no acute distress HEENT: Normal NECK: No JVD; No carotid bruits LYMPHATICS: No lymphadenopathy CARDIAC: RRR, no murmurs, no rubs, no gallops RESPIRATORY:  Clear to auscultation without rales, wheezing or rhonchi  ABDOMEN: Soft, non-tender, non-distended MUSCULOSKELETAL:  No edema; No deformity  SKIN: Warm and dry LOWER EXTREMITIES: no swelling NEUROLOGIC:  Alert and oriented x 3 PSYCHIATRIC:  Normal affect   ASSESSMENT:    1. Chronic heart failure with preserved ejection fraction (HCC)   2. Essential hypertension   3. Diabetes mellitus without complication (HCC)   4. Dyspnea on exertion    PLAN:    In order of problems listed above:  Chronic diastolic congestive heart failure compensated with stable continue present management. Essential hypertension slightly on the higher side in the office but she checked it at home and usually 130/70 at home we will continue present management. Diabetes followed by antimedicine team I have last hemoglobin A1c from November of last year  6.6%.  Relatively well-controlled continue present management. Dyspnea exertion denies having any. She takes 12.5 mg metoprolol  succinate daily which is very small dose on top of that she is bradycardic she said she had difficulty breaking pill to such a small number therefore I simply stop her metoprolol  increase dose of losartan  to 50 mg daily.  Chem-7 will be done next week   Medication Adjustments/Labs and Tests Ordered: Current medicines are reviewed at length with the patient today.  Concerns regarding medicines are outlined above.  No orders of the defined types were placed in this encounter.  Medication changes: No orders of the defined types  were placed in this encounter.   Signed, Manfred Seed, MD, [facility] 08/09/2023 8:45 AM    [facility]

## 2023-08-16 DIAGNOSIS — I10 Essential (primary) hypertension: Secondary | ICD-10-CM | POA: Diagnosis not present

## 2023-08-17 LAB — BASIC METABOLIC PANEL WITH GFR
BUN/Creatinine Ratio: 17 (ref 12–28)
BUN: 18 mg/dL (ref 8–27)
CO2: 25 mmol/L (ref 20–29)
Calcium: 8.8 mg/dL (ref 8.7–10.3)
Chloride: 103 mmol/L (ref 96–106)
Creatinine, Ser: 1.06 mg/dL — ABNORMAL HIGH (ref 0.57–1.00)
Glucose: 246 mg/dL — ABNORMAL HIGH (ref 70–99)
Potassium: 4.8 mmol/L (ref 3.5–5.2)
Sodium: 143 mmol/L (ref 134–144)
eGFR: 53 mL/min/{1.73_m2} — ABNORMAL LOW (ref >59)

## 2023-08-22 ENCOUNTER — Telehealth: Payer: Self-pay

## 2023-08-22 NOTE — Telephone Encounter (Signed)
 LVM per DPR- per Dr. Vanetta Shawl note regarding lab results. Encouraged to call with any questions. Routed to PCP.

## 2023-08-24 DIAGNOSIS — J9601 Acute respiratory failure with hypoxia: Secondary | ICD-10-CM | POA: Diagnosis not present

## 2023-08-24 DIAGNOSIS — I5031 Acute diastolic (congestive) heart failure: Secondary | ICD-10-CM | POA: Diagnosis not present

## 2023-09-24 DIAGNOSIS — J9601 Acute respiratory failure with hypoxia: Secondary | ICD-10-CM | POA: Diagnosis not present

## 2023-09-24 DIAGNOSIS — I5031 Acute diastolic (congestive) heart failure: Secondary | ICD-10-CM | POA: Diagnosis not present

## 2023-10-24 DIAGNOSIS — I5031 Acute diastolic (congestive) heart failure: Secondary | ICD-10-CM | POA: Diagnosis not present

## 2023-10-24 DIAGNOSIS — J9601 Acute respiratory failure with hypoxia: Secondary | ICD-10-CM | POA: Diagnosis not present

## 2023-11-24 DIAGNOSIS — I5031 Acute diastolic (congestive) heart failure: Secondary | ICD-10-CM | POA: Diagnosis not present

## 2023-11-24 DIAGNOSIS — J9601 Acute respiratory failure with hypoxia: Secondary | ICD-10-CM | POA: Diagnosis not present

## 2024-01-09 DIAGNOSIS — E1169 Type 2 diabetes mellitus with other specified complication: Secondary | ICD-10-CM | POA: Diagnosis not present

## 2024-01-09 DIAGNOSIS — E039 Hypothyroidism, unspecified: Secondary | ICD-10-CM | POA: Diagnosis not present

## 2024-01-09 DIAGNOSIS — E782 Mixed hyperlipidemia: Secondary | ICD-10-CM | POA: Diagnosis not present

## 2024-01-09 DIAGNOSIS — E538 Deficiency of other specified B group vitamins: Secondary | ICD-10-CM | POA: Diagnosis not present

## 2024-01-09 DIAGNOSIS — K529 Noninfective gastroenteritis and colitis, unspecified: Secondary | ICD-10-CM | POA: Diagnosis not present

## 2024-01-09 DIAGNOSIS — D638 Anemia in other chronic diseases classified elsewhere: Secondary | ICD-10-CM | POA: Diagnosis not present

## 2024-02-07 DIAGNOSIS — R0602 Shortness of breath: Secondary | ICD-10-CM | POA: Diagnosis not present

## 2024-02-07 DIAGNOSIS — Z79899 Other long term (current) drug therapy: Secondary | ICD-10-CM | POA: Diagnosis not present

## 2024-02-07 DIAGNOSIS — E119 Type 2 diabetes mellitus without complications: Secondary | ICD-10-CM | POA: Diagnosis not present

## 2024-02-07 DIAGNOSIS — Z91041 Radiographic dye allergy status: Secondary | ICD-10-CM | POA: Diagnosis not present

## 2024-02-07 DIAGNOSIS — I110 Hypertensive heart disease with heart failure: Secondary | ICD-10-CM | POA: Diagnosis not present

## 2024-02-07 DIAGNOSIS — D509 Iron deficiency anemia, unspecified: Secondary | ICD-10-CM | POA: Diagnosis not present

## 2024-02-07 DIAGNOSIS — I5031 Acute diastolic (congestive) heart failure: Secondary | ICD-10-CM | POA: Diagnosis not present

## 2024-02-07 DIAGNOSIS — E039 Hypothyroidism, unspecified: Secondary | ICD-10-CM | POA: Diagnosis not present

## 2024-02-07 DIAGNOSIS — Z794 Long term (current) use of insulin: Secondary | ICD-10-CM | POA: Diagnosis not present

## 2024-02-07 DIAGNOSIS — M1990 Unspecified osteoarthritis, unspecified site: Secondary | ICD-10-CM | POA: Diagnosis not present

## 2024-02-07 DIAGNOSIS — Z7982 Long term (current) use of aspirin: Secondary | ICD-10-CM | POA: Diagnosis not present

## 2024-02-07 DIAGNOSIS — Z885 Allergy status to narcotic agent status: Secondary | ICD-10-CM | POA: Diagnosis not present

## 2024-02-08 DIAGNOSIS — I517 Cardiomegaly: Secondary | ICD-10-CM | POA: Diagnosis not present

## 2024-02-08 DIAGNOSIS — I5031 Acute diastolic (congestive) heart failure: Secondary | ICD-10-CM | POA: Diagnosis not present

## 2024-02-08 DIAGNOSIS — E119 Type 2 diabetes mellitus without complications: Secondary | ICD-10-CM | POA: Diagnosis not present

## 2024-02-08 DIAGNOSIS — I110 Hypertensive heart disease with heart failure: Secondary | ICD-10-CM | POA: Diagnosis not present

## 2024-02-08 DIAGNOSIS — I352 Nonrheumatic aortic (valve) stenosis with insufficiency: Secondary | ICD-10-CM | POA: Diagnosis not present

## 2024-02-13 DIAGNOSIS — Z23 Encounter for immunization: Secondary | ICD-10-CM | POA: Diagnosis not present

## 2024-02-17 ENCOUNTER — Other Ambulatory Visit: Payer: Self-pay

## 2024-02-17 DIAGNOSIS — I10 Essential (primary) hypertension: Secondary | ICD-10-CM | POA: Insufficient documentation

## 2024-02-17 DIAGNOSIS — Z9889 Other specified postprocedural states: Secondary | ICD-10-CM | POA: Insufficient documentation

## 2024-02-18 ENCOUNTER — Ambulatory Visit: Attending: Cardiology | Admitting: Cardiology

## 2024-02-18 ENCOUNTER — Encounter: Payer: Self-pay | Admitting: Cardiology

## 2024-02-18 VITALS — BP 140/60 | HR 56 | Ht 60.0 in | Wt 171.0 lb

## 2024-02-18 DIAGNOSIS — I5032 Chronic diastolic (congestive) heart failure: Secondary | ICD-10-CM | POA: Diagnosis not present

## 2024-02-18 DIAGNOSIS — R0609 Other forms of dyspnea: Secondary | ICD-10-CM

## 2024-02-18 DIAGNOSIS — E782 Mixed hyperlipidemia: Secondary | ICD-10-CM | POA: Diagnosis not present

## 2024-02-18 DIAGNOSIS — I10 Essential (primary) hypertension: Secondary | ICD-10-CM

## 2024-02-18 NOTE — Progress Notes (Unsigned)
 Cardiology Office Note:    Date:  02/18/2024   ID:  [name], DOB 10-10-1940, MRN [id number]  PCP:  Fernand Tracey LABOR, MD  Cardiologist:  Lamar Fitch, MD    Referring MD: Fernand Tracey LABOR, MD   Chief Complaint  Patient presents with   Follow-up    History of Present Illness:    [name] is a 83 y.o. female past medical history significant for diastolic congestive heart failure, GERD, diabetes, hypertension, dyslipidemia, hypothyroidism.  Just a few days ago she ended up being in a hospital because of shortness of breath, she was diuresed discharged home comes today feeling fine but still complain of having some shortness of breath.  Her weight is up compared to the time she was here last time looking at the record from hospital diuresis has not been impressive.  She check her weight every single day at home and it is stable  Past Medical History:  Diagnosis Date   (HFpEF) heart failure with preserved ejection fraction (HCC) 08/26/2019   Acute on chronic diastolic CHF (congestive heart failure) (HCC) 09/28/2016   Chest pain 09/27/2016   COVID-19 virus infection 03/29/2019   Diabetes mellitus without complication (HCC)    Dyspnea on exertion    Essential hypertension 01/28/2019   GERD (gastroesophageal reflux disease)    History of cardiac catheterization    a. normal cors by cath in 2007.   HLD (hyperlipidemia)    Hypertension    Hypertensive urgency 09/27/2016   Hypothyroidism    Left lower quadrant pain 10/31/2017   Macrocytic anemia 09/27/2016   Migraine 10/13/2013   Morbid obesity (HCC) 01/28/2019    Past Surgical History:  Procedure Laterality Date   ABDOMINAL HYSTERECTOMY     NECK SURGERY      Current Medications: Current Meds  Medication Sig   aspirin  EC 81 MG tablet Take 81 mg by mouth daily. Swallow whole.   atorvastatin  (LIPITOR) 20 MG tablet Take 20 mg by mouth daily.   cyanocobalamin  (VITAMIN B12) 1000 MCG tablet Take  1,000 mcg by mouth daily.   DULoxetine (CYMBALTA) 30 MG capsule Take 30 mg by mouth daily.   empagliflozin (JARDIANCE) 25 MG TABS tablet Take 25 mg by mouth daily.   ferrous sulfate 324 MG TBEC Take 324 mg by mouth daily with breakfast.   furosemide  (LASIX ) 20 MG tablet Take 20 mg by mouth daily.   gabapentin  (NEURONTIN ) 600 MG tablet Take 600 mg by mouth 3 (three) times daily.   Insulin  Glargine (LANTUS SOLOSTAR) 100 UNIT/ML Solostar Pen Inject 20 Units into the skin daily.   levothyroxine  (SYNTHROID , LEVOTHROID) 75 MCG tablet Take 75 mcg by mouth daily.   loratadine  (CLARITIN ) 10 MG tablet Take 10 mg by mouth daily.   losartan  (COZAAR ) 50 MG tablet Take 1 tablet (50 mg total) by mouth daily.   metoprolol  succinate (TOPROL  XL) 25 MG 24 hr tablet Take 0.5 tablets (12.5 mg total) by mouth daily.   Multiple Vitamin (MULTI-VITAMINS) TABS Take 1 tablet by mouth daily.   pantoprazole  (PROTONIX ) 40 MG tablet Take 40 mg by mouth daily.     Allergies:   Iohexol, Morphine, Iodinated contrast media, Simvastatin, and Codeine   Social History   Socioeconomic History   Marital status: Married    Spouse name: Not on file   Number of children: Not on file   Years of education: Not on file   Highest education level: Not on file  Occupational History  Not on file  Tobacco Use   Smoking status: Never   Smokeless tobacco: Never  Substance and Sexual Activity   Alcohol use: No   Drug use: No   Sexual activity: Not on file  Other Topics Concern   Not on file  Social History Narrative   Not on file   Social Drivers of Health   Financial Resource Strain: Not on file  Food Insecurity: Not on file  Transportation Needs: Not on file  Physical Activity: Not on file  Stress: Not on file  Social Connections: Not on file     Family History: The patient's family history includes Cancer in her brother; Cerebral aneurysm in her mother; Hypertension in her mother; Pancreatic cancer in her  father. ROS:   Please see the history of present illness.    All 14 point review of systems negative except as described per history of present illness  EKGs/Labs/Other Studies Reviewed:         Recent Labs: 04/25/2023: ALT 8; Hemoglobin 9.7; NT-Pro BNP 373; Platelets 420 08/16/2023: BUN 18; Creatinine, Ser 1.06; Potassium 4.8; Sodium 143  Recent Lipid Panel    Component Value Date/Time   CHOL 121 09/28/2016 0245   TRIG 142 09/28/2016 0245   HDL 26 (L) 09/28/2016 0245   CHOLHDL 4.7 09/28/2016 0245   VLDL 28 09/28/2016 0245   LDLCALC 67 09/28/2016 0245    Physical Exam:    VS:  BP (!) 140/60   Pulse (!) 56   Ht 5' (1.524 m)   Wt 171 lb (77.6 kg)   SpO2 96%   BMI 33.40 kg/m     Wt Readings from Last 3 Encounters:  02/18/24 171 lb (77.6 kg)  08/09/23 161 lb 3.2 oz (73.1 kg)  04/25/23 159 lb (72.1 kg)     GEN:  Well nourished, well developed in no acute distress HEENT: Normal NECK: No JVD; No carotid bruits LYMPHATICS: No lymphadenopathy CARDIAC: RRR, no murmurs, no rubs, no gallops RESPIRATORY:  Clear to auscultation without rales, wheezing or rhonchi  ABDOMEN: Soft, non-tender, non-distended MUSCULOSKELETAL:  No edema; No deformity  SKIN: Warm and dry LOWER EXTREMITIES: no swelling NEUROLOGIC:  Alert and oriented x 3 PSYCHIATRIC:  Normal affect   ASSESSMENT:    1. Essential hypertension   2. Dyspnea on exertion   3. Chronic heart failure with preserved ejection fraction (HFpEF) (HCC)   4. Mixed hyperlipidemia    PLAN:    In order of problems listed above:  Diastolic congestive heart failure.  She appears to be compensated on physical exam will check Chem-7 and proBNP.  She was given 40 mg of Lasix  daily now reduced to 20 we will check if significant abnormalities.  I still have no good explanation for her symptomatology look like diastolic congestive heart failure but in the future we will do ischemia workup. Dyspnea on exertion as described  above. Essential hypertension blood pressure seems to be [place] on the higher side anticipate need to increase diuretic. Mixed dyslipidemia stable Record from hospital reviewed for this visit   Medication Adjustments/Labs and Tests Ordered: Current medicines are reviewed at length with the patient today.  Concerns regarding medicines are outlined above.  Orders Placed This Encounter  Procedures   Basic metabolic panel with GFR   Pro b natriuretic peptide (BNP)   Medication changes: No orders of the defined types were placed in this encounter.   Signed, Lamar DOROTHA Fitch, MD, [facility] 02/18/2024 11:55 AM    [facility]

## 2024-02-18 NOTE — Patient Instructions (Signed)
 Medication Instructions:  Your physician recommends that you continue on your current medications as directed. Please refer to the Current Medication list given to you today.  *If you need a refill on your cardiac medications before your next appointment, please call your pharmacy*   Lab Work: BMP-PROBNP- today If you have labs (blood work) drawn today and your tests are completely normal, you will receive your results only by: MyChart Message (if you have MyChart) OR A paper copy in the mail If you have any lab test that is abnormal or we need to change your treatment, we will call you to review the results.   Testing/Procedures: None Ordered   Follow-Up: At [facility], you and your health needs are our priority.  As part of our continuing mission to provide you with exceptional heart care, we have created designated Provider Care Teams.  These Care Teams include your primary Cardiologist (physician) and Advanced Practice Providers (APPs -  Physician Assistants and Nurse Practitioners) who all work together to provide you with the care you need, when you need it.  We recommend signing up for the patient portal called MyChart.  Sign up information is provided on this After Visit Summary.  MyChart is used to connect with patients for Virtual Visits (Telemedicine).  Patients are able to view lab/test results, encounter notes, upcoming appointments, etc.  Non-urgent messages can be sent to your provider as well.   To learn more about what you can do with MyChart, go to [email or web address].    Your next appointment:   4 week(s)  The format for your next appointment:   In Person  Provider:   Lamar Fitch, MD    Other Instructions NA

## 2024-02-20 ENCOUNTER — Ambulatory Visit: Payer: Self-pay | Admitting: Cardiology

## 2024-02-20 DIAGNOSIS — E875 Hyperkalemia: Secondary | ICD-10-CM

## 2024-02-20 LAB — BASIC METABOLIC PANEL WITH GFR
BUN/Creatinine Ratio: 17 (ref 12–28)
BUN: 21 mg/dL (ref 8–27)
CO2: 21 mmol/L (ref 20–29)
Calcium: 9.3 mg/dL (ref 8.7–10.3)
Chloride: 98 mmol/L (ref 96–106)
Creatinine, Ser: 1.22 mg/dL — ABNORMAL HIGH (ref 0.57–1.00)
Glucose: 154 mg/dL — ABNORMAL HIGH (ref 70–99)
Potassium: 5.5 mmol/L — ABNORMAL HIGH (ref 3.5–5.2)
Sodium: 142 mmol/L (ref 134–144)
eGFR: 45 mL/min/1.73 — ABNORMAL LOW (ref >59)

## 2024-02-20 LAB — PRO B NATRIURETIC PEPTIDE: NT-Pro BNP: 478 pg/mL (ref 0–738)

## 2024-03-04 NOTE — Telephone Encounter (Signed)
 Attempted to call patient with results, no voicemail was set up, unable to leave voicemail.  Labs routed to PCP and orders placed.   Alan, RN

## 2024-03-09 ENCOUNTER — Telehealth: Payer: Self-pay

## 2024-03-09 NOTE — Telephone Encounter (Signed)
 PAP: Patient assistance application for Lantus through [employer] has been mailed to pt's home address on file. Provider portion of application will be faxed to provider's office.  PAP: Patient assistance application for Jardiance through [employer]) has been mailed to pt's home address on file. Provider portion of application will be faxed to provider's office.  Provider portion of application has been faxed to Dr. Tracey Bathe at [facility]

## 2024-03-10 NOTE — Telephone Encounter (Signed)
 Received provider portion of patient assistance applications

## 2024-03-11 LAB — BASIC METABOLIC PANEL WITH GFR
BUN/Creatinine Ratio: 12 (ref 12–28)
BUN: 13 mg/dL (ref 8–27)
CO2: 25 mmol/L (ref 20–29)
Calcium: 9.1 mg/dL (ref 8.7–10.3)
Chloride: 98 mmol/L (ref 96–106)
Creatinine, Ser: 1.08 mg/dL — ABNORMAL HIGH (ref 0.57–1.00)
Glucose: 259 mg/dL — ABNORMAL HIGH (ref 70–99)
Potassium: 4.6 mmol/L (ref 3.5–5.2)
Sodium: 138 mmol/L (ref 134–144)
eGFR: 51 mL/min/1.73 — ABNORMAL LOW (ref >59)

## 2024-03-20 ENCOUNTER — Encounter: Payer: Self-pay | Admitting: Cardiology

## 2024-03-20 ENCOUNTER — Ambulatory Visit: Attending: Cardiology | Admitting: Cardiology

## 2024-03-20 VITALS — BP 148/60 | HR 70 | Ht 60.0 in | Wt 172.0 lb

## 2024-03-20 DIAGNOSIS — E119 Type 2 diabetes mellitus without complications: Secondary | ICD-10-CM | POA: Diagnosis not present

## 2024-03-20 DIAGNOSIS — I5032 Chronic diastolic (congestive) heart failure: Secondary | ICD-10-CM | POA: Diagnosis not present

## 2024-03-20 DIAGNOSIS — R0609 Other forms of dyspnea: Secondary | ICD-10-CM

## 2024-03-20 DIAGNOSIS — I10 Essential (primary) hypertension: Secondary | ICD-10-CM | POA: Diagnosis not present

## 2024-03-20 DIAGNOSIS — E782 Mixed hyperlipidemia: Secondary | ICD-10-CM

## 2024-03-20 NOTE — Progress Notes (Unsigned)
 Cardiology Office Note:    Date:  03/20/2024   ID:  [name], DOB Jun 15, 1940, MRN [id number]  PCP:  Fernand Tracey LABOR, MD  Cardiologist:  Lamar Fitch, MD    Referring MD: Fernand Tracey LABOR, MD   Chief Complaint  Patient presents with   Follow-up  Doing fine but still short of breath  History of Present Illness:    [name] is a 83 y.o. female past medical history significant for diabetes, essential hypertension, dyslipidemia, recently admitted to the hospital because there is decompensated diastolic congestive heart failure, diuresed improved coming back, show follow-up still complaining having shortness of breath and more about potentially having angina equivalent.  Her proBNP was normal recently, Chem-7 looks only mild elevation of creatinine.  Past Medical History:  Diagnosis Date   (HFpEF) heart failure with preserved ejection fraction (HCC) 08/26/2019   Acute on chronic diastolic CHF (congestive heart failure) (HCC) 09/28/2016   Chest pain 09/27/2016   COVID-19 virus infection 03/29/2019   Diabetes mellitus without complication (HCC)    Dyspnea on exertion    Essential hypertension 01/28/2019   GERD (gastroesophageal reflux disease)    History of cardiac catheterization    a. normal cors by cath in 2007.   HLD (hyperlipidemia)    Hypertension    Hypertensive urgency 09/27/2016   Hypothyroidism    Left lower quadrant pain 10/31/2017   Macrocytic anemia 09/27/2016   Migraine 10/13/2013   Morbid obesity (HCC) 01/28/2019    Past Surgical History:  Procedure Laterality Date   ABDOMINAL HYSTERECTOMY     NECK SURGERY      Current Medications: Current Meds  Medication Sig   aspirin  EC 81 MG tablet Take 81 mg by mouth daily. Swallow whole.   atorvastatin  (LIPITOR) 20 MG tablet Take 20 mg by mouth daily.   cyanocobalamin  (VITAMIN B12) 1000 MCG tablet Take 1,000 mcg by mouth daily.   DULoxetine (CYMBALTA) 30 MG capsule Take 30 mg by mouth  daily.   empagliflozin (JARDIANCE) 25 MG TABS tablet Take 25 mg by mouth daily.   ferrous sulfate 324 MG TBEC Take 324 mg by mouth daily with breakfast.   furosemide  (LASIX ) 20 MG tablet Take 20 mg by mouth daily.   gabapentin  (NEURONTIN ) 600 MG tablet Take 600 mg by mouth 3 (three) times daily.   Insulin  Glargine (LANTUS SOLOSTAR) 100 UNIT/ML Solostar Pen Inject 20 Units into the skin daily.   levothyroxine  (SYNTHROID , LEVOTHROID) 75 MCG tablet Take 75 mcg by mouth daily.   loratadine  (CLARITIN ) 10 MG tablet Take 10 mg by mouth daily.   losartan  (COZAAR ) 50 MG tablet Take 1 tablet (50 mg total) by mouth daily.   metoprolol  succinate (TOPROL  XL) 25 MG 24 hr tablet Take 0.5 tablets (12.5 mg total) by mouth daily.   Multiple Vitamin (MULTI-VITAMINS) TABS Take 1 tablet by mouth daily.   pantoprazole  (PROTONIX ) 40 MG tablet Take 40 mg by mouth daily.     Allergies:   Iohexol, Morphine, Iodinated contrast media, Simvastatin, and Codeine   Social History   Socioeconomic History   Marital status: Married    Spouse name: Not on file   Number of children: Not on file   Years of education: Not on file   Highest education level: Not on file  Occupational History   Not on file  Tobacco Use   Smoking status: Never   Smokeless tobacco: Never  Substance and Sexual Activity   Alcohol use: No  Drug use: No   Sexual activity: Not on file  Other Topics Concern   Not on file  Social History Narrative   Not on file   Social Drivers of Health   Financial Resource Strain: Not on file  Food Insecurity: Not on file  Transportation Needs: Not on file  Physical Activity: Not on file  Stress: Not on file  Social Connections: Not on file     Family History: The patient's family history includes Cancer in her brother; Cerebral aneurysm in her mother; Hypertension in her mother; Pancreatic cancer in her father. ROS:   Please see the history of present illness.    All 14 point review of systems  negative except as described per history of present illness  EKGs/Labs/Other Studies Reviewed:         Recent Labs: 04/25/2023: ALT 8; Hemoglobin 9.7; Platelets 420 02/18/2024: NT-Pro BNP 478 03/10/2024: BUN 13; Creatinine, Ser 1.08; Potassium 4.6; Sodium 138  Recent Lipid Panel    Component Value Date/Time   CHOL 121 09/28/2016 0245   TRIG 142 09/28/2016 0245   HDL 26 (L) 09/28/2016 0245   CHOLHDL 4.7 09/28/2016 0245   VLDL 28 09/28/2016 0245   LDLCALC 67 09/28/2016 0245    Physical Exam:    VS:  BP (!) 148/60   Pulse 70   Ht 5' (1.524 m)   Wt 172 lb (78 kg)   SpO2 95%   BMI 33.59 kg/m     Wt Readings from Last 3 Encounters:  03/20/24 172 lb (78 kg)  02/18/24 171 lb (77.6 kg)  08/09/23 161 lb 3.2 oz (73.1 kg)     GEN:  Well nourished, well developed in no acute distress HEENT: Normal NECK: No JVD; No carotid bruits LYMPHATICS: No lymphadenopathy CARDIAC: RRR, no murmurs, no rubs, no gallops RESPIRATORY:  Clear to auscultation without rales, wheezing or rhonchi  ABDOMEN: Soft, non-tender, non-distended MUSCULOSKELETAL:  No edema; No deformity  SKIN: Warm and dry LOWER EXTREMITIES: no swelling NEUROLOGIC:  Alert and oriented x 3 PSYCHIATRIC:  Normal affect   ASSESSMENT:    1. Chronic heart failure with preserved ejection fraction (HFpEF) (HCC)   2. Essential hypertension   3. Diabetes mellitus without complication (HCC)   4. Mixed hyperlipidemia   5. Morbid obesity (HCC)    PLAN:    In order of problems listed above:  Chronic diastolic congestive heart failure.  Exam showed compensated status, will continue present management. Multiple risk factors for coronary artery disease with some atypical symptoms I still cannot perfectly explain, will do a stress test. Diabetes follow-up by internal medicine team will continue present management. Mixed dyslipidemia, on Lipitor 20 which I continue I did review [facility] which show me data from 2023 we will try to get more  up-to-date information about cholesterol   Medication Adjustments/Labs and Tests Ordered: Current medicines are reviewed at length with the patient today.  Concerns regarding medicines are outlined above.  No orders of the defined types were placed in this encounter.  Medication changes: No orders of the defined types were placed in this encounter.   Signed, Lamar DOROTHA Fitch, MD, [facility] 03/20/2024 10:05 AM    [facility]

## 2024-03-20 NOTE — Patient Instructions (Signed)
Medication Instructions:  ?Your physician recommends that you continue on your current medications as directed. Please refer to the Current Medication list given to you today.  ?*If you need a refill on your cardiac medications before your next appointment, please call your pharmacy* ? ? ?Lab Work: ?None Ordered ?If you have labs (blood work) drawn today and your tests are completely normal, you will receive your results only by: ?MyChart Message (if you have MyChart) OR ?A paper copy in the mail ?If you have any lab test that is abnormal or we need to change your treatment, we will call you to review the results. ? ? ?Testing/Procedures: ?Your physician has requested that you have a lexiscan myoview. For further information please visit [email or web address]. Please follow instruction sheet, as given. ? ?The test will take approximately 3 to 4 hours to complete; you may bring reading material.  If someone comes with you to your appointment, they will need to remain in the main lobby due to limited space in the testing area. ? ?How to prepare for your Myocardial Perfusion Test: ?Do not eat or drink 3 hours prior to your test, except you may have water. ?Do not consume products containing caffeine (regular or decaffeinated) 12 hours prior to your test. (ex: coffee, chocolate, sodas, tea). ?Do bring a list of your current medications with you.  If not listed below, you may take your medications as normal. ?Do wear comfortable clothes (no dresses or overalls) and walking shoes, tennis shoes preferred (No heels or open toe shoes are allowed). ?Do NOT wear cologne, perfume, aftershave, or lotions (deodorant is allowed). ?If these instructions are not followed, your test will have to be rescheduled.   ? ? ?Follow-Up: ?At [facility], you and your health needs are our priority.  As part of our continuing mission to provide you with exceptional heart care, we have created designated Provider Care Teams.  These Care Teams  include your primary Cardiologist (physician) and Advanced Practice Providers (APPs -  Physician Assistants and Nurse Practitioners) who all work together to provide you with the care you need, when you need it. ? ?We recommend signing up for the patient portal called "MyChart".  Sign up information is provided on this After Visit Summary.  MyChart is used to connect with patients for Virtual Visits (Telemedicine).  Patients are able to view lab/test results, encounter notes, upcoming appointments, etc.  Non-urgent messages can be sent to your provider as well.   ?To learn more about what you can do with MyChart, go to [email or web address].   ? ?Your next appointment:   ?3 month(s) ? ?The format for your next appointment:   ?In Person ? ?Provider:   ?Jenne Campus, MD  ? ? ?Other Instructions ?NA  ?

## 2024-03-24 ENCOUNTER — Telehealth: Payer: Self-pay | Admitting: *Deleted

## 2024-03-24 NOTE — Telephone Encounter (Signed)
 LEFT DETAILED INSTRUCTIONS FOR STRESS SCHEDULED ON 03/25/24.

## 2024-03-25 ENCOUNTER — Ambulatory Visit: Attending: Cardiology

## 2024-03-25 DIAGNOSIS — R0609 Other forms of dyspnea: Secondary | ICD-10-CM | POA: Diagnosis not present

## 2024-03-25 LAB — MYOCARDIAL PERFUSION IMAGING
LV dias vol: 68 mL (ref 46–106)
LV sys vol: 17 mL (ref 3.8–5.2)
Nuc Stress EF: 75 %
Peak HR: 96 {beats}/min
Rest HR: 57 {beats}/min
Rest Nuclear Isotope Dose: 10.2 mCi
SDS: 2
SRS: 2
SSS: 4
ST Depression (mm): 0 mm
Stress Nuclear Isotope Dose: 30.8 mCi
TID: 0.97

## 2024-03-25 MED ORDER — TECHNETIUM TC 99M TETROFOSMIN IV KIT
10.2000 | PACK | Freq: Once | INTRAVENOUS | Status: AC | PRN
  Administered 2024-03-25: 10.2 via INTRAVENOUS

## 2024-03-25 MED ORDER — TECHNETIUM TC 99M TETROFOSMIN IV KIT
30.8000 | PACK | Freq: Once | INTRAVENOUS | Status: AC | PRN
  Administered 2024-03-25: 30.8 via INTRAVENOUS

## 2024-03-25 MED ORDER — AMINOPHYLLINE 25 MG/ML IV SOLN
75.0000 mg | Freq: Once | INTRAVENOUS | Status: AC
  Administered 2024-03-25: 75 mg via INTRAVENOUS

## 2024-03-25 MED ORDER — REGADENOSON 0.4 MG/5ML IV SOLN
0.4000 mg | Freq: Once | INTRAVENOUS | Status: AC
  Administered 2024-03-25: 0.4 mg via INTRAVENOUS

## 2024-03-27 ENCOUNTER — Ambulatory Visit: Payer: Self-pay | Admitting: Cardiology

## 2024-04-20 ENCOUNTER — Other Ambulatory Visit (HOSPITAL_COMMUNITY): Payer: Self-pay

## 2024-04-20 NOTE — Telephone Encounter (Signed)
 Left voice mail for patient regarding patient assistance applications mailed 11/24, asked patient to return my call to let me know if she received applications

## 2024-04-20 NOTE — Telephone Encounter (Signed)
 PAP: Application for Bernadine has been submitted to [employer]), via fax  PAP: Application for Lantus has been submitted to [employer], via fax

## 2024-04-30 NOTE — Telephone Encounter (Signed)
 Spoke with rep at [facility] they could not find her application.  Re-faxed completed application

## 2024-05-11 ENCOUNTER — Other Ambulatory Visit (HOSPITAL_COMMUNITY): Payer: Self-pay

## 2024-05-11 NOTE — Telephone Encounter (Signed)
 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

## 2024-06-19 ENCOUNTER — Ambulatory Visit: Admitting: Cardiology
